# Patient Record
Sex: Female | Born: 1990 | Race: White | Hispanic: No | Marital: Married | Smoking: Current every day smoker
Health system: Southern US, Community
[De-identification: ages and names within clinical notes are randomized; demographics above are authoritative.]

## PROBLEM LIST (undated history)

## (undated) ENCOUNTER — Inpatient Hospital Stay (HOSPITAL_COMMUNITY): Payer: Self-pay

## (undated) DIAGNOSIS — J45909 Unspecified asthma, uncomplicated: Secondary | ICD-10-CM

## (undated) DIAGNOSIS — F319 Bipolar disorder, unspecified: Secondary | ICD-10-CM

## (undated) DIAGNOSIS — F4310 Post-traumatic stress disorder, unspecified: Secondary | ICD-10-CM

## (undated) DIAGNOSIS — Z62819 Personal history of unspecified abuse in childhood: Secondary | ICD-10-CM

## (undated) DIAGNOSIS — F99 Mental disorder, not otherwise specified: Secondary | ICD-10-CM

## (undated) HISTORY — DX: Post-traumatic stress disorder, unspecified: F43.10

## (undated) HISTORY — DX: Mental disorder, not otherwise specified: F99

## (undated) HISTORY — DX: Bipolar disorder, unspecified: F31.9

## (undated) HISTORY — DX: Unspecified asthma, uncomplicated: J45.909

## (undated) HISTORY — PX: NO PAST SURGERIES: SHX2092

## (undated) HISTORY — DX: Personal history of unspecified abuse in childhood: Z62.819

---

## 2013-03-01 ENCOUNTER — Encounter: Payer: Self-pay | Admitting: Obstetrics & Gynecology

## 2013-03-01 ENCOUNTER — Other Ambulatory Visit (HOSPITAL_COMMUNITY)
Admission: RE | Admit: 2013-03-01 | Discharge: 2013-03-01 | Disposition: A | Discharge status: Home / Self Care | Payer: Medicaid Other | Source: Ambulatory Visit | Attending: Obstetrics & Gynecology | Admitting: Obstetrics & Gynecology

## 2013-03-01 ENCOUNTER — Ambulatory Visit (INDEPENDENT_AMBULATORY_CARE_PROVIDER_SITE_OTHER): Payer: Medicaid Other | Admitting: Obstetrics & Gynecology

## 2013-03-01 VITALS — BP 118/73 | Temp 97.8°F | Ht 63.0 in | Wt 138.8 lb

## 2013-03-01 DIAGNOSIS — Z113 Encounter for screening for infections with a predominantly sexual mode of transmission: Secondary | ICD-10-CM | POA: Insufficient documentation

## 2013-03-01 DIAGNOSIS — O99330 Smoking (tobacco) complicating pregnancy, unspecified trimester: Secondary | ICD-10-CM

## 2013-03-01 DIAGNOSIS — Z3490 Encounter for supervision of normal pregnancy, unspecified, unspecified trimester: Secondary | ICD-10-CM | POA: Insufficient documentation

## 2013-03-01 DIAGNOSIS — Z01419 Encounter for gynecological examination (general) (routine) without abnormal findings: Secondary | ICD-10-CM | POA: Insufficient documentation

## 2013-03-01 DIAGNOSIS — O0932 Supervision of pregnancy with insufficient antenatal care, second trimester: Secondary | ICD-10-CM

## 2013-03-01 DIAGNOSIS — O3680X0 Pregnancy with inconclusive fetal viability, not applicable or unspecified: Secondary | ICD-10-CM

## 2013-03-01 DIAGNOSIS — O99332 Smoking (tobacco) complicating pregnancy, second trimester: Secondary | ICD-10-CM

## 2013-03-01 LAB — POCT URINALYSIS DIP (DEVICE)
Glucose, UA: NEGATIVE mg/dL
Leukocytes, UA: NEGATIVE
Nitrite: NEGATIVE
Protein, ur: 30 mg/dL — AB
Urobilinogen, UA: 1 mg/dL (ref 0.0–1.0)
pH: 7.5 (ref 5.0–8.0)

## 2013-03-01 NOTE — Progress Notes (Signed)
Pulse- 88 Weight gain 25-35lb New ob packet given Flu vaccine declined

## 2013-03-01 NOTE — Progress Notes (Signed)
Subjective:    [name] is being seen today for her first obstetrical visit.  This is not a planned pregnancy. She is at Unknown gestation. Her obstetrical history is significant for smoker. Relationship with FOB: significant other. Patient does intend to breast feed. Pregnancy history fully reviewed.  Menstrual History: OB History   Grav Para Term Preterm Abortions TAB SAB Ect Mult Living   1                No LMP recorded. Patient is pregnant. LMP unknown    The following portions of the patient's history were reviewed and updated as appropriate: allergies, current medications, past family history, past medical history, past social history, past surgical history and problem list.  Review of Systems Pertinent items are noted in HPI.    Objective:    BP 118/73  Temp(Src) 97.8 F (36.6 C)  Ht 5\' 3"  (1.6 m)  Wt 138 lb 12.8 oz (62.959 kg)  BMI 24.59 kg/m2  General Appearance:    Alert, cooperative, no distress, appears stated age              Throat:   Lips, mucosa, and tongue normal; teeth and gums normal  Neck:   Supple, symmetrical, trachea midline, no adenopathy;    thyroid:  no enlargement/tenderness/nodules; no carotid   bruit or JVD  Back:     Symmetric, no curvature, ROM normal, no CVA tenderness  Lungs:     Clear to auscultation bilaterally, respirations unlabored  Chest Wall:    No tenderness or deformity   Heart:    Regular rate and rhythm, S1 and S2 normal, no murmur, rub   or gallop  Breast Exam:    No tenderness, masses, or nipple abnormality  Abdomen:     Soft, non-tender, bowel sounds active all four quadrants,    no masses, no organomegaly; gravid ~20 weeks.  FH @umb   Genitalia:    Normal female without lesion, discharge or tenderness  Rectal:    Normal tone, normal prostate, no masses or tenderness;   guaiac negative stool  Extremities:   Extremities normal, atraumatic, no cyanosis or edema  Pulses:   2+ and symmetric all extremities  Skin:    Skin color, texture, turgor normal, no rashes or lesions; mulptiple tatoos  Lymph nodes:   Cervical, supraclavicular, and axillary nodes normal         Assessment:    Pregnancy at 20 and 0/7 weeks by exam Late PNC with unknown LMP   Plan:    Initial labs drawn. Prenatal vitamins. Problem list reviewed and updated. AFP3 discussed: requested. Role of ultrasound in pregnancy discussed; fetal survey: requested. Amniocentesis discussed: not indicated. Follow up in 4 weeks. 50% of 25 min visit spent on counseling and coordination of care.  Reviewed tob cessation

## 2013-03-01 NOTE — Patient Instructions (Signed)
Pregnancy and Smoking Smoking during pregnancy is very unhealthy for the mother and the developing fetus. The addictive drug in cigarettes (nicotine), carbon monoxide, and many other poisons are inhaled from a cigarette and are carried through your bloodstream to your fetus. Cigarette smoke contains more than 2,500 chemicals. It is not known which of these chemicals are harmful to the developing fetus. However, both nicotine and carbon monoxide play a role in causing health problems in pregnancy. Effects on the fetus of smoking during pregnancy:  Decrease in blood flow and oxygen to the uterus, placenta, and your fetus.  Increased heart rate of the fetus.  Slowing of your fetus's growth in the uterus (intrauterine growth retardation).  Placental problems. Placenta may partially cover or completely cover the opening to the cervix (placenta previa), or the placenta may partially or completely separate from the uterus (placental abruption).  Increase risk of pregnancy outside of the uterus (tubal pregnancy).  Premature rupture membranes, causing the sac that holds the fetus to break too early, resulting in premature birth and increased health risks to the newborn.  Increased risk of birth defects, including heart defects.  Increased risk of miscarriage. Newborns born to women who smoke during pregnancy:  Are more likely to be born too early (prematurely).  Are more likely to be at a low birth weight.  Are at risk for serious health problems, chronic or lifelong disabilities (cerebral palsy, mental retardation, learning problems), and possibly even death  Are at risk of Sudden Infant Death Syndrome (SIDS).  Have higher rates of miscarriage and stillbirth.  Have more lung and breathing (respiratory) problems. Long-term effects on a child's behavior: Some of the following trends are seen with children of smoking mothers:  Increased risk for drug abuse, behavior, and conduct  disorders.  Increased risk for smoking in adolescent girls.  Increased risk for negative behavior in 2-year-olds.  Increase risk for asthma, colic, and childhood obesity, which can lead to diabetes.  Increased risk for finger and toe disorders. Resources to stop smoking during pregnancy:  Counseling.  Psychological treatment.  Acupuncture.  Family intervention.  Hypnosis.  Medicines that are safe to take during pregnancy. Nicotine supplements have not been studied enough. They should only be considered when all other methods fail.  Telephone QUIT lines. Smoking and Breastfeeding:  Nicotine gets passed to the infant through a mother's breastmilk. This can cause nausea, colic, cramping, and diarrhea in the infant.  Smoking may reduce milk supply and interfere with the let-down response.  Even formula-fed infants of mothers who smoke have nicotine and cotinine (nicotine by-product) in their urine. Other resources to help stop smoking:  American Cancer Society: www.cancer.org  American Heart Association: www.americanheart.org  National Cancer Institute: www.cancer.gov  Smoke Free Families: www.smokefreefamilies.[address] [place] Line): 3140842239 START Document Released: 08/05/2004 Document Revised: 06/16/2011 Document Reviewed: 01/03/2009 [facility] Patient Information 2014 [place], [place]. Breastfeeding Deciding to breastfeed is one of the best choices you can make for you and your baby. A change in hormones during pregnancy causes your breast tissue to grow and increases the number and size of your milk ducts. These hormones also allow proteins, sugars, and fats from your blood supply to make breast milk in your milk-producing glands. Hormones prevent breast milk from being released before your baby is born as well as prompt milk flow after birth. Once breastfeeding has begun, thoughts of your baby, as well as his or her sucking or crying, can stimulate the release of  milk from your  milk-producing glands.  BENEFITS OF BREASTFEEDING For Your Baby  Your first milk (colostrum) helps your baby's digestive system function better.   There are antibodies in your milk that help your baby fight off infections.   Your baby has a lower incidence of asthma, allergies, and sudden infant death syndrome.   The nutrients in breast milk are better for your baby than infant formulas and are designed uniquely for your baby's needs.   Breast milk improves your baby's brain development.   Your baby is less likely to develop other conditions, such as childhood obesity, asthma, or type 2 diabetes mellitus.  For You   Breastfeeding helps to create a very special bond between you and your baby.   Breastfeeding is convenient. Breast milk is always available at the correct temperature and costs nothing.   Breastfeeding helps to burn calories and helps you lose the weight gained during pregnancy.   Breastfeeding makes your uterus contract to its prepregnancy size faster and slows bleeding (lochia) after you give birth.   Breastfeeding helps to lower your risk of developing type 2 diabetes mellitus, osteoporosis, and breast or ovarian cancer later in life. SIGNS THAT YOUR BABY IS HUNGRY Early Signs of Hunger  Increased alertness or activity.  Stretching.  Movement of the head from side to side.  Movement of the head and opening of the mouth when the corner of the mouth or cheek is stroked (rooting).  Increased sucking sounds, smacking lips, cooing, sighing, or squeaking.  Hand-to-mouth movements.  Increased sucking of fingers or hands. Late Signs of Hunger  Fussing.  Intermittent crying. Extreme Signs of Hunger Signs of extreme hunger will require calming and consoling before your baby will be able to breastfeed successfully. Do not wait for the following signs of extreme hunger to occur before you initiate breastfeeding:   Restlessness.  A  loud, strong cry.   Screaming. BREASTFEEDING BASICS Breastfeeding Initiation  Find a comfortable place to sit or lie down, with your neck and back well supported.  Place a pillow or rolled up blanket under your baby to bring him or her to the level of your breast (if you are seated). Nursing pillows are specially designed to help support your arms and your baby while you breastfeed.  Make sure that your baby's abdomen is facing your abdomen.   Gently massage your breast. With your fingertips, massage from your chest wall toward your nipple in a circular motion. This encourages milk flow. You may need to continue this action during the feeding if your milk flows slowly.  Support your breast with 4 fingers underneath and your thumb above your nipple. Make sure your fingers are well away from your nipple and your baby's mouth.   Stroke your baby's lips gently with your finger or nipple.   When your baby's mouth is open wide enough, quickly bring your baby to your breast, placing your entire nipple and as much of the colored area around your nipple (areola) as possible into your baby's mouth.   More areola should be visible above your baby's upper lip than below the lower lip.   Your baby's tongue should be between his or her lower gum and your breast.   Ensure that your baby's mouth is correctly positioned around your nipple (latched). Your baby's lips should create a seal on your breast and be turned out (everted).  It is common for your baby to suck about 2 3 minutes in order to start the flow of breast  milk. Latching Teaching your baby how to latch on to your breast properly is very important. An improper latch can cause nipple pain and decreased milk supply for you and poor weight gain in your baby. Also, if your baby is not latched onto your nipple properly, he or she may swallow some air during feeding. This can make your baby fussy. Burping your baby when you switch breasts  during the feeding can help to get rid of the air. However, teaching your baby to latch on properly is still the best way to prevent fussiness from swallowing air while breastfeeding. Signs that your baby has successfully latched on to your nipple:    Silent tugging or silent sucking, without causing you pain.   Swallowing heard between every 3 4 sucks.    Muscle movement above and in front of his or her ears while sucking.  Signs that your baby has not successfully latched on to nipple:   Sucking sounds or smacking sounds from your baby while breastfeeding.  Nipple pain. If you think your baby has not latched on correctly, slip your finger into the corner of your baby's mouth to break the suction and place it between your baby's gums. Attempt breastfeeding initiation again. Signs of Successful Breastfeeding Signs from your baby:   A gradual decrease in the number of sucks or complete cessation of sucking.   Falling asleep.   Relaxation of his or her body.   Retention of a small amount of milk in his or her mouth.   Letting go of your breast by himself or herself. Signs from you:  Breasts that have increased in firmness, weight, and size 1 3 hours after feeding.   Breasts that are softer immediately after breastfeeding.  Increased milk volume, as well as a change in milk consistency and color by the 5th day of breastfeeding.   Nipples that are not sore, cracked, or bleeding. Signs That Your Pecola Leisure is Getting Enough Milk  Wetting at least 3 diapers in a 24-hour period. The urine should be clear and pale yellow by age 29 days.  At least 3 stools in a 24-hour period by age 29 days. The stool should be soft and yellow.  At least 3 stools in a 24-hour period by age 74 days. The stool should be seedy and yellow.  No loss of weight greater than 10% of birth weight during the first 1 days of age.  Average weight gain of 4 7 ounces (120 210 mL) per week after age 6  days.  Consistent daily weight gain by age 29 days, without weight loss after the age of 2 weeks. After a feeding, your baby may spit up a small amount. This is common. BREASTFEEDING FREQUENCY AND DURATION Frequent feeding will help you make more milk and can prevent sore nipples and breast engorgement. Breastfeed when you feel the need to reduce the fullness of your breasts or when your baby shows signs of hunger. This is called "breastfeeding on demand." Avoid introducing a pacifier to your baby while you are working to establish breastfeeding (the first 4 6 weeks after your baby is born). After this time you may choose to use a pacifier. Research has shown that pacifier use during the first year of a baby's life decreases the risk of sudden infant death syndrome (SIDS). Allow your baby to feed on each breast as long as he or she wants. Breastfeed until your baby is finished feeding. When your baby unlatches or falls asleep  while feeding from the first breast, offer the second breast. Because newborns are often sleepy in the first few weeks of life, you may need to awaken your baby to get him or her to feed. Breastfeeding times will vary from baby to baby. However, the following rules can serve as a guide to help you ensure that your baby is properly fed:  Newborns (babies 73 weeks of age or younger) may breastfeed every 1 3 hours.  Newborns should not go longer than 3 hours during the day or 5 hours during the night without breastfeeding.  You should breastfeed your baby a minimum of 8 times in a 24-hour period until you begin to introduce solid foods to your baby at around 74 months of age. BREAST MILK PUMPING Pumping and storing breast milk allows you to ensure that your baby is exclusively fed your breast milk, even at times when you are unable to breastfeed. This is especially important if you are going back to work while you are still breastfeeding or when you are not able to be present during  feedings. Your lactation consultant can give you guidelines on how long it is safe to store breast milk.  A breast pump is a machine that allows you to pump milk from your breast into a sterile bottle. The pumped breast milk can then be stored in a refrigerator or freezer. Some breast pumps are operated by hand, while others use electricity. Ask your lactation consultant which type will work best for you. Breast pumps can be purchased, but some hospitals and breastfeeding support groups lease breast pumps on a monthly basis. A lactation consultant can teach you how to hand express breast milk, if you prefer not to use a pump.  CARING FOR YOUR BREASTS WHILE YOU BREASTFEED Nipples can become dry, cracked, and sore while breastfeeding. The following recommendations can help keep your breasts moisturized and healthy:  Avoid using soap on your nipples.   Wear a supportive bra. Although not required, special nursing bras and tank tops are designed to allow access to your breasts for breastfeeding without taking off your entire bra or top. Avoid wearing underwire style bras or extremely tight bras.  Air dry your nipples for 3 after each feeding.   Use only cotton bra pads to absorb leaked breast milk. Leaking of breast milk between feedings is normal.   Use lanolin on your nipples after breastfeeding. Lanolin helps to maintain your skin's normal moisture barrier. If you use pure lanolin you do not need to wash it off before feeding your baby again. Pure lanolin is not toxic to your baby. You may also hand express a few drops of breast milk and gently massage that milk into your nipples and allow the milk to air dry. In the first few weeks after giving birth, some women experience extremely full breasts (engorgement). Engorgement can make your breasts feel heavy, warm, and tender to the touch. Engorgement peaks within 3 5 days after you give birth. The following recommendations can help ease  engorgement:  Completely empty your breasts while breastfeeding or pumping. You may want to start by applying warm, moist heat (in the shower or with warm water-soaked hand towels) just before feeding or pumping. This increases circulation and helps the milk flow. If your baby does not completely empty your breasts while breastfeeding, pump any extra milk after he or she is finished.  Wear a snug bra (nursing or regular) or tank top for 1 2 days to  signal your body to slightly decrease milk production.  Apply ice packs to your breasts, unless this is too uncomfortable for you.  Make sure that your baby is latched on and positioned properly while breastfeeding. If engorgement persists after 48 hours of following these recommendations, contact your health care provider or a Advertising copywriter. OVERALL HEALTH CARE RECOMMENDATIONS WHILE BREASTFEEDING  Eat healthy foods. Alternate between meals and snacks, eating 3 of each per day. Because what you eat affects your breast milk, some of the foods may make your baby more irritable than usual. Avoid eating these foods if you are sure that they are negatively affecting your baby.  Drink milk, fruit juice, and water to satisfy your thirst (about 10 glasses a day).   Rest often, relax, and continue to take your prenatal vitamins to prevent fatigue, stress, and anemia.  Continue breast self-awareness checks.  Avoid chewing and smoking tobacco.  Avoid alcohol and drug use. Some medicines that may be harmful to your baby can pass through breast milk. It is important to ask your health care provider before taking any medicine, including all over-the-counter and prescription medicine as well as vitamin and herbal supplements. It is possible to become pregnant while breastfeeding. If birth control is desired, ask your health care provider about options that will be safe for your baby. SEEK MEDICAL CARE IF:   You feel like you want to stop breastfeeding  or have become frustrated with breastfeeding.  You have painful breasts or nipples.  Your nipples are cracked or bleeding.  Your breasts are red, tender, or warm.  You have a swollen area on either breast.  You have a fever or chills.  You have nausea or vomiting.  You have drainage other than breast milk from your nipples.  Your breasts do not become full before feedings by the 5th day after you give birth.  You feel sad and depressed.  Your baby is too sleepy to eat well.  Your baby is having trouble sleeping.   Your baby is wetting less than 3 diapers in a 24-hour period.  Your baby has less than 3 stools in a 24-hour period.  Your baby's skin or the white part of his or her eyes becomes yellow.   Your baby is not gaining weight by 71 days of age. SEEK IMMEDIATE MEDICAL CARE IF:   Your baby is overly tired (lethargic) and does not want to wake up and feed.  Your baby develops an unexplained fever. Document Released: 03/24/2005 Document Revised: 11/24/2012 Document Reviewed: 09/15/2012 [facility] Patient Information 2014 [place], [place].

## 2013-03-02 LAB — OBSTETRIC PANEL
Antibody Screen: NEGATIVE
Basophils Absolute: 0 10*3/uL (ref 0.0–0.1)
Basophils Relative: 0 % (ref 0–1)
Eosinophils Relative: 1 % (ref 0–5)
Hepatitis B Surface Ag: NEGATIVE
Lymphs Abs: 2 10*3/uL (ref 0.7–4.0)
MCHC: 34.6 g/dL (ref 30.0–36.0)
MCV: 87 fL (ref 78.0–100.0)
Monocytes Absolute: 0.7 10*3/uL (ref 0.1–1.0)
Neutro Abs: 6.6 10*3/uL (ref 1.7–7.7)
Neutrophils Relative %: 69 % (ref 43–77)
Platelets: 194 10*3/uL (ref 150–400)
RBC: 3.69 MIL/uL — ABNORMAL LOW (ref 3.87–5.11)
RDW: 13.4 % (ref 11.5–15.5)
Rubella: 3.73 Index — ABNORMAL HIGH (ref <0.90)
WBC: 9.5 10*3/uL (ref 4.0–10.5)

## 2013-03-02 LAB — PRESCRIPTION MONITORING PROFILE (19 PANEL)
Amphetamine/Meth: NEGATIVE ng/mL
Barbiturate Screen, Urine: NEGATIVE ng/mL
Cannabinoid Scrn, Ur: NEGATIVE ng/mL
Carisoprodol, Urine: NEGATIVE ng/mL
Cocaine Metabolites: NEGATIVE ng/mL
Creatinine, Urine: 195.23 mg/dL (ref >20.0)
Fentanyl, Ur: NEGATIVE ng/mL
Meperidine, Ur: NEGATIVE ng/mL
Methadone Screen, Urine: NEGATIVE ng/mL
Methaqualone: NEGATIVE ng/mL
Oxycodone Screen, Ur: NEGATIVE ng/mL
Phencyclidine, Ur: NEGATIVE ng/mL
Tapentadol, urine: NEGATIVE ng/mL
pH, Initial: 7.5 pH (ref 4.5–8.9)

## 2013-03-02 LAB — CULTURE, OB URINE: Organism ID, Bacteria: NO GROWTH

## 2013-03-09 ENCOUNTER — Ambulatory Visit (HOSPITAL_COMMUNITY)
Admission: RE | Admit: 2013-03-09 | Discharge: 2013-03-09 | Disposition: A | Discharge status: Home / Self Care | Payer: Medicaid Other | Source: Ambulatory Visit | Attending: Obstetrics & Gynecology | Admitting: Obstetrics & Gynecology

## 2013-03-09 ENCOUNTER — Other Ambulatory Visit: Payer: Self-pay | Admitting: Obstetrics & Gynecology

## 2013-03-09 DIAGNOSIS — O0932 Supervision of pregnancy with insufficient antenatal care, second trimester: Secondary | ICD-10-CM

## 2013-03-09 DIAGNOSIS — Z3689 Encounter for other specified antenatal screening: Secondary | ICD-10-CM | POA: Insufficient documentation

## 2013-03-09 DIAGNOSIS — O99332 Smoking (tobacco) complicating pregnancy, second trimester: Secondary | ICD-10-CM

## 2013-03-09 DIAGNOSIS — O3680X0 Pregnancy with inconclusive fetal viability, not applicable or unspecified: Secondary | ICD-10-CM

## 2013-03-09 DIAGNOSIS — O0930 Supervision of pregnancy with insufficient antenatal care, unspecified trimester: Secondary | ICD-10-CM | POA: Insufficient documentation

## 2013-03-12 ENCOUNTER — Encounter: Payer: Self-pay | Admitting: Obstetrics & Gynecology

## 2013-03-14 ENCOUNTER — Telehealth: Payer: Self-pay | Admitting: *Deleted

## 2013-03-14 DIAGNOSIS — Z3400 Encounter for supervision of normal first pregnancy, unspecified trimester: Secondary | ICD-10-CM

## 2013-03-14 NOTE — Telephone Encounter (Signed)
Message copied by Gerome Apley on Mon Mar 14, 2013  4:08 PM ------      Message from: Willodean Rosenthal      Created: Sun Mar 13, 2013  3:33 PM       Please schedule f/u OB sono in 4-6 weeks to complete anatomy.            Thx,      clh-S ------

## 2013-03-14 NOTE — Telephone Encounter (Signed)
Ordered and scheduled ultrasound for 04/06/13. Called patient, unable to leave message as I heard message subscriber is not taking incoming calls.  Left note on next ob appt  to tell pt of [place] appt.

## 2013-03-15 NOTE — Telephone Encounter (Signed)
Called patient and message stated this person is not accepting incoming calls. Will send patient mychart message, since she is active and prefers that form of communication

## 2013-03-29 ENCOUNTER — Encounter: Payer: Self-pay | Admitting: Obstetrics & Gynecology

## 2013-04-06 ENCOUNTER — Ambulatory Visit (HOSPITAL_COMMUNITY)
Admission: RE | Admit: 2013-04-06 | Discharge: 2013-04-06 | Disposition: A | Discharge status: Home / Self Care | Payer: Medicaid Other | Source: Ambulatory Visit | Attending: Obstetrics & Gynecology | Admitting: Obstetrics & Gynecology

## 2013-04-06 DIAGNOSIS — O0930 Supervision of pregnancy with insufficient antenatal care, unspecified trimester: Secondary | ICD-10-CM | POA: Insufficient documentation

## 2013-04-06 DIAGNOSIS — Z3400 Encounter for supervision of normal first pregnancy, unspecified trimester: Secondary | ICD-10-CM

## 2013-04-06 DIAGNOSIS — Z3689 Encounter for other specified antenatal screening: Secondary | ICD-10-CM | POA: Insufficient documentation

## 2013-04-06 DIAGNOSIS — O0932 Supervision of pregnancy with insufficient antenatal care, second trimester: Secondary | ICD-10-CM

## 2013-04-07 NOTE — L&D Delivery Note (Signed)
Delivery by Dr. Adriana Simasook under direct supervision by me.  Cheral MarkerKimberly R. Hykeem Ojeda, CNM, [facility] CenterWHNP-BC 07/10/2013 7:05 AM

## 2013-04-07 NOTE — L&D Delivery Note (Signed)
Delivery Note At 6:25 AM a viable female was delivered via Vaginal, Spontaneous Delivery (Presentation: Left Occiput Anterior).  APGAR: 9, 9; weight - pending. Placenta status: Intact, Spontaneous.  Cord: 3 vessels with the following complications: None.  Cord pH: N/A.  Anesthesia: Epidural  Episiotomy: None Lacerations: Labial wall tear x 2 and 1st degree perineal  Suture Repair: 3.0 vicryl and 4.0 vicryl. Est. Blood Loss (mL): 250  Mom to postpartum.  Baby to Couplet care / Skin to Skin.  [name], Logen Heintzelman 07/10/2013, 6:52 AM

## 2013-04-12 ENCOUNTER — Ambulatory Visit (INDEPENDENT_AMBULATORY_CARE_PROVIDER_SITE_OTHER): Payer: Medicaid Other | Admitting: Advanced Practice Midwife

## 2013-04-12 ENCOUNTER — Encounter: Payer: Self-pay | Admitting: Advanced Practice Midwife

## 2013-04-12 VITALS — BP 111/67 | Temp 96.6°F | Wt 144.2 lb

## 2013-04-12 DIAGNOSIS — O99330 Smoking (tobacco) complicating pregnancy, unspecified trimester: Secondary | ICD-10-CM

## 2013-04-12 DIAGNOSIS — O99332 Smoking (tobacco) complicating pregnancy, second trimester: Secondary | ICD-10-CM

## 2013-04-12 LAB — POCT URINALYSIS DIP (DEVICE)
BILIRUBIN URINE: NEGATIVE
Glucose, UA: NEGATIVE mg/dL
Hgb urine dipstick: NEGATIVE
Ketones, ur: NEGATIVE mg/dL
LEUKOCYTES UA: NEGATIVE
NITRITE: NEGATIVE
PH: 7 (ref 5.0–8.0)
PROTEIN: NEGATIVE mg/dL
Specific Gravity, Urine: 1.01 (ref 1.005–1.030)
Urobilinogen, UA: 0.2 mg/dL (ref 0.0–1.0)

## 2013-04-12 NOTE — Progress Notes (Signed)
Pulse- 87 

## 2013-04-12 NOTE — Progress Notes (Signed)
Doing well.  Good fetal movement, denies vaginal bleeding, LOF, regular contractions.  Discussed risks of smoking r/t low birth weight and SIDS.  Pt states understanding.

## 2013-04-13 LAB — CBC
HCT: 31 % — ABNORMAL LOW (ref 36.0–46.0)
Hemoglobin: 11 g/dL — ABNORMAL LOW (ref 12.0–15.0)
MCH: 30.4 pg (ref 26.0–34.0)
MCHC: 35.5 g/dL (ref 30.0–36.0)
MCV: 85.6 fL (ref 78.0–100.0)
Platelets: 189 10*3/uL (ref 150–400)
RBC: 3.62 MIL/uL — AB (ref 3.87–5.11)
RDW: 13.2 % (ref 11.5–15.5)
WBC: 7.3 10*3/uL (ref 4.0–10.5)

## 2013-04-13 LAB — RPR

## 2013-04-13 LAB — HIV ANTIBODY (ROUTINE TESTING W REFLEX): HIV: NONREACTIVE

## 2013-04-13 LAB — GLUCOSE TOLERANCE, 1 HOUR (50G) W/O FASTING: Glucose, 1 Hour GTT: 72 mg/dL (ref 70–140)

## 2013-04-26 ENCOUNTER — Ambulatory Visit (INDEPENDENT_AMBULATORY_CARE_PROVIDER_SITE_OTHER): Payer: Medicaid Other | Admitting: Obstetrics and Gynecology

## 2013-04-26 VITALS — BP 109/71 | Temp 97.3°F | Wt 145.9 lb

## 2013-04-26 DIAGNOSIS — Z3480 Encounter for supervision of other normal pregnancy, unspecified trimester: Secondary | ICD-10-CM

## 2013-04-26 DIAGNOSIS — O99332 Smoking (tobacco) complicating pregnancy, second trimester: Secondary | ICD-10-CM

## 2013-04-26 DIAGNOSIS — O99330 Smoking (tobacco) complicating pregnancy, unspecified trimester: Secondary | ICD-10-CM

## 2013-04-26 LAB — POCT URINALYSIS DIP (DEVICE)
BILIRUBIN URINE: NEGATIVE
Glucose, UA: NEGATIVE mg/dL
Hgb urine dipstick: NEGATIVE
KETONES UR: NEGATIVE mg/dL
Leukocytes, UA: NEGATIVE
Nitrite: NEGATIVE
PH: 6 (ref 5.0–8.0)
Protein, ur: NEGATIVE mg/dL
Specific Gravity, Urine: <=1.005 (ref 1.005–1.030)
Urobilinogen, UA: 0.2 mg/dL (ref 0.0–1.0)

## 2013-04-26 NOTE — Progress Notes (Signed)
Evaluation and management procedures were performed by SNM under my supervision/collaboration. Chart reviewed, patient examined by me and I agree with management and plan. FH checked.

## 2013-04-26 NOTE — Patient Instructions (Signed)
Second Trimester of Pregnancy The second trimester is from week 13 through week 28, months 4 through 6. The second trimester is often a time when you feel your best. Your body has also adjusted to being pregnant, and you begin to feel better physically. Usually, morning sickness has lessened or quit completely, you may have more energy, and you may have an increase in appetite. The second trimester is also a time when the fetus is growing rapidly. At the end of the sixth month, the fetus is about 9 inches long and weighs about 1 pounds. You will likely begin to feel the baby move (quickening) between 18 and 20 weeks of the pregnancy. BODY CHANGES Your body goes through many changes during pregnancy. The changes vary from woman to woman.   Your weight will continue to increase. You will notice your lower abdomen bulging out.  You may begin to get stretch marks on your hips, abdomen, and breasts.  You may develop headaches that can be relieved by medicines approved by your caregiver.  You may urinate more often because the fetus is pressing on your bladder.  You may develop or continue to have heartburn as a result of your pregnancy.  You may develop constipation because certain hormones are causing the muscles that push waste through your intestines to slow down.  You may develop hemorrhoids or swollen, bulging veins (varicose veins).  You may have back pain because of the weight gain and pregnancy hormones relaxing your joints between the bones in your pelvis and as a result of a shift in weight and the muscles that support your balance.  Your breasts will continue to grow and be tender.  Your gums may bleed and may be sensitive to brushing and flossing.  Dark spots or blotches (chloasma, mask of pregnancy) may develop on your face. This will likely fade after the baby is born.  A dark line from your belly button to the pubic area (linea nigra) may appear. This will likely fade after the  baby is born. WHAT TO EXPECT AT YOUR PRENATAL VISITS During a routine prenatal visit:  You will be weighed to make sure you and the fetus are growing normally.  Your blood pressure will be taken.  Your abdomen will be measured to track your baby's growth.  The fetal heartbeat will be listened to.  Any test results from the previous visit will be discussed. Your caregiver may ask you:  How you are feeling.  If you are feeling the baby move.  If you have had any abnormal symptoms, such as leaking fluid, bleeding, severe headaches, or abdominal cramping.  If you have any questions. Other tests that may be performed during your second trimester include:  Blood tests that check for:  Low iron levels (anemia).  Gestational diabetes (between 24 and 28 weeks).  Rh antibodies.  Urine tests to check for infections, diabetes, or protein in the urine.  An ultrasound to confirm the proper growth and development of the baby.  An amniocentesis to check for possible genetic problems.  Fetal screens for spina bifida and Down syndrome. HOME CARE INSTRUCTIONS   Avoid all smoking, herbs, alcohol, and unprescribed drugs. These chemicals affect the formation and growth of the baby.  Follow your caregiver's instructions regarding medicine use. There are medicines that are either safe or unsafe to take during pregnancy.  Exercise only as directed by your caregiver. Experiencing uterine cramps is a good sign to stop exercising.  Continue to eat regular,   healthy meals.  Wear a good support bra for breast tenderness.  Do not use hot tubs, steam rooms, or saunas.  Wear your seat belt at all times when driving.  Avoid raw meat, uncooked cheese, cat litter boxes, and soil used by cats. These carry germs that can cause birth defects in the baby.  Take your prenatal vitamins.  Try taking a stool softener (if your caregiver approves) if you develop constipation. Eat more high-fiber foods,  such as fresh vegetables or fruit and whole grains. Drink plenty of fluids to keep your urine clear or pale yellow.  Take warm sitz baths to soothe any pain or discomfort caused by hemorrhoids. Use hemorrhoid cream if your caregiver approves.  If you develop varicose veins, wear support hose. Elevate your feet for 15 minutes, 3 4 times a day. Limit salt in your diet.  Avoid heavy lifting, wear low heel shoes, and practice good posture.  Rest with your legs elevated if you have leg cramps or low back pain.  Visit your dentist if you have not gone yet during your pregnancy. Use a soft toothbrush to brush your teeth and be gentle when you floss.  A sexual relationship may be continued unless your caregiver directs you otherwise.  Continue to go to all your prenatal visits as directed by your caregiver. SEEK MEDICAL CARE IF:   You have dizziness.  You have mild pelvic cramps, pelvic pressure, or nagging pain in the abdominal area.  You have persistent nausea, vomiting, or diarrhea.  You have a bad smelling vaginal discharge.  You have pain with urination. SEEK IMMEDIATE MEDICAL CARE IF:   You have a fever.  You are leaking fluid from your vagina.  You have spotting or bleeding from your vagina.  You have severe abdominal cramping or pain.  You have rapid weight gain or loss.  You have shortness of breath with chest pain.  You notice sudden or extreme swelling of your face, hands, ankles, feet, or legs.  You have not felt your baby move in over an hour.  You have severe headaches that do not go away with medicine.  You have vision changes. Document Released: 03/18/2001 Document Revised: 11/24/2012 Document Reviewed: 05/25/2012 Exit[employer] Patient Information 2014 [employer].  

## 2013-04-26 NOTE — Progress Notes (Signed)
P= 79 Requests PNV to be prescribed.

## 2013-04-26 NOTE — Progress Notes (Signed)
No pregnancy related complaints today.  Baby has been very active. Denies VB, LOF, contractions.

## 2013-05-02 ENCOUNTER — Inpatient Hospital Stay (HOSPITAL_COMMUNITY)
Admission: AD | Admit: 2013-05-02 | Discharge: 2013-05-02 | Disposition: A | Discharge status: Home / Self Care | Payer: Medicaid Other | Source: Ambulatory Visit | Attending: Obstetrics and Gynecology | Admitting: Obstetrics and Gynecology

## 2013-05-02 ENCOUNTER — Encounter (HOSPITAL_COMMUNITY): Payer: Self-pay | Admitting: *Deleted

## 2013-05-02 DIAGNOSIS — A499 Bacterial infection, unspecified: Secondary | ICD-10-CM | POA: Insufficient documentation

## 2013-05-02 DIAGNOSIS — O4700 False labor before 37 completed weeks of gestation, unspecified trimester: Secondary | ICD-10-CM | POA: Insufficient documentation

## 2013-05-02 DIAGNOSIS — O2390 Unspecified genitourinary tract infection in pregnancy, unspecified trimester: Secondary | ICD-10-CM | POA: Insufficient documentation

## 2013-05-02 DIAGNOSIS — O368130 Decreased fetal movements, third trimester, not applicable or unspecified: Secondary | ICD-10-CM

## 2013-05-02 DIAGNOSIS — R109 Unspecified abdominal pain: Secondary | ICD-10-CM | POA: Insufficient documentation

## 2013-05-02 DIAGNOSIS — N760 Acute vaginitis: Secondary | ICD-10-CM | POA: Insufficient documentation

## 2013-05-02 DIAGNOSIS — O368190 Decreased fetal movements, unspecified trimester, not applicable or unspecified: Secondary | ICD-10-CM | POA: Insufficient documentation

## 2013-05-02 DIAGNOSIS — O99332 Smoking (tobacco) complicating pregnancy, second trimester: Secondary | ICD-10-CM

## 2013-05-02 DIAGNOSIS — B9689 Other specified bacterial agents as the cause of diseases classified elsewhere: Secondary | ICD-10-CM | POA: Insufficient documentation

## 2013-05-02 DIAGNOSIS — Z3490 Encounter for supervision of normal pregnancy, unspecified, unspecified trimester: Secondary | ICD-10-CM

## 2013-05-02 DIAGNOSIS — O99330 Smoking (tobacco) complicating pregnancy, unspecified trimester: Secondary | ICD-10-CM | POA: Insufficient documentation

## 2013-05-02 LAB — URINALYSIS, ROUTINE W REFLEX MICROSCOPIC
BILIRUBIN URINE: NEGATIVE
Glucose, UA: NEGATIVE mg/dL
HGB URINE DIPSTICK: NEGATIVE
Ketones, ur: NEGATIVE mg/dL
Leukocytes, UA: NEGATIVE
Nitrite: NEGATIVE
PH: 6 (ref 5.0–8.0)
Protein, ur: NEGATIVE mg/dL
Urobilinogen, UA: 0.2 mg/dL (ref 0.0–1.0)

## 2013-05-02 LAB — WET PREP, GENITAL
TRICH WET PREP: NONE SEEN
WBC WET PREP: NONE SEEN
Yeast Wet Prep HPF POC: NONE SEEN

## 2013-05-02 MED ORDER — METRONIDAZOLE 500 MG PO TABS: 500.0000 mg | ORAL_TABLET | Freq: Two times a day (BID) | ORAL | Status: DC
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
## 2013-05-02 NOTE — MAU Note (Signed)
Dr. Ike Benedom notified of pt.

## 2013-05-02 NOTE — MAU Note (Signed)
Dr. Ike Benedom at the bedside.  U/S performed.

## 2013-05-02 NOTE — Discharge Instructions (Signed)
Fetal Movement Counts Patient Name: __________________________________________________ Patient Due Date: ____________________ Performing a fetal movement count is highly recommended in high-risk pregnancies, but it is good for every pregnant woman to do. Your caregiver may ask you to start counting fetal movements at 28 weeks of the pregnancy. Fetal movements often increase:  After eating a full meal.  After physical activity.  After eating or drinking something sweet or cold.  At rest. Pay attention to when you feel the baby is most active. This will help you notice a pattern of your baby's sleep and wake cycles and what factors contribute to an increase in fetal movement. It is important to perform a fetal movement count at the same time each day when your baby is normally most active.  HOW TO COUNT FETAL MOVEMENTS 1. Find a quiet and comfortable area to sit or lie down on your left side. Lying on your left side provides the best blood and oxygen circulation to your baby. 2. Write down the day and time on a sheet of paper or in a journal. 3. Start counting kicks, flutters, swishes, rolls, or jabs in a 2 hour period. You should feel at least 10 movements within 2 hours. 4. If you do not feel 10 movements in 2 hours, wait 2 3 hours and count again. Look for a change in the pattern or not enough counts in 2 hours. SEEK MEDICAL CARE IF:  You feel less than 10 counts in 2 hours, tried twice.  There is no movement in over an hour.  The pattern is changing or taking longer each day to reach 10 counts in 2 hours.  You feel the baby is not moving as he or she usually does. Date: ____________ Movements: ____________ Start time: ____________ Finish time: ____________  Date: ____________ Movements: ____________ Start time: ____________ Finish time: ____________ Date: ____________ Movements: ____________ Start time: ____________ Finish time: ____________ Date: ____________ Movements: ____________  Start time: ____________ Finish time: ____________ Date: ____________ Movements: ____________ Start time: ____________ Finish time: ____________ Date: ____________ Movements: ____________ Start time: ____________ Finish time: ____________ Date: ____________ Movements: ____________ Start time: ____________ Finish time: ____________ Date: ____________ Movements: ____________ Start time: ____________ Finish time: ____________  Date: ____________ Movements: ____________ Start time: ____________ Finish time: ____________ Date: ____________ Movements: ____________ Start time: ____________ Finish time: ____________ Date: ____________ Movements: ____________ Start time: ____________ Finish time: ____________ Date: ____________ Movements: ____________ Start time: ____________ Finish time: ____________ Date: ____________ Movements: ____________ Start time: ____________ Finish time: ____________ Date: ____________ Movements: ____________ Start time: ____________ Finish time: ____________ Date: ____________ Movements: ____________ Start time: ____________ Finish time: ____________  Date: ____________ Movements: ____________ Start time: ____________ Finish time: ____________ Date: ____________ Movements: ____________ Start time: ____________ Finish time: ____________ Date: ____________ Movements: ____________ Start time: ____________ Finish time: ____________ Date: ____________ Movements: ____________ Start time: ____________ Finish time: ____________ Date: ____________ Movements: ____________ Start time: ____________ Finish time: ____________ Date: ____________ Movements: ____________ Start time: ____________ Finish time: ____________ Date: ____________ Movements: ____________ Start time: ____________ Finish time: ____________  Date: ____________ Movements: ____________ Start time: ____________ Finish time: ____________ Date: ____________ Movements: ____________ Start time: ____________ Finish time:  ____________ Date: ____________ Movements: ____________ Start time: ____________ Finish time: ____________ Date: ____________ Movements: ____________ Start time: ____________ Finish time: ____________ Date: ____________ Movements: ____________ Start time: ____________ Finish time: ____________ Date: ____________ Movements: ____________ Start time: ____________ Finish time: ____________ Date: ____________ Movements: ____________ Start time: ____________ Finish time: ____________  Date: ____________ Movements: ____________ Start time: ____________ Finish   time: ____________ Date: ____________ Movements: ____________ Start time: ____________ Finish time: ____________ Date: ____________ Movements: ____________ Start time: ____________ Finish time: ____________ Date: ____________ Movements: ____________ Start time: ____________ Finish time: ____________ Date: ____________ Movements: ____________ Start time: ____________ Finish time: ____________ Date: ____________ Movements: ____________ Start time: ____________ Finish time: ____________ Date: ____________ Movements: ____________ Start time: ____________ Finish time: ____________  Date: ____________ Movements: ____________ Start time: ____________ Finish time: ____________ Date: ____________ Movements: ____________ Start time: ____________ Finish time: ____________ Date: ____________ Movements: ____________ Start time: ____________ Finish time: ____________ Date: ____________ Movements: ____________ Start time: ____________ Finish time: ____________ Date: ____________ Movements: ____________ Start time: ____________ Finish time: ____________ Date: ____________ Movements: ____________ Start time: ____________ Finish time: ____________ Date: ____________ Movements: ____________ Start time: ____________ Finish time: ____________  Date: ____________ Movements: ____________ Start time: ____________ Finish time: ____________ Date: ____________ Movements:  ____________ Start time: ____________ Finish time: ____________ Date: ____________ Movements: ____________ Start time: ____________ Finish time: ____________ Date: ____________ Movements: ____________ Start time: ____________ Finish time: ____________ Date: ____________ Movements: ____________ Start time: ____________ Finish time: ____________ Date: ____________ Movements: ____________ Start time: ____________ Finish time: ____________ Date: ____________ Movements: ____________ Start time: ____________ Finish time: ____________  Date: ____________ Movements: ____________ Start time: ____________ Finish time: ____________ Date: ____________ Movements: ____________ Start time: ____________ Finish time: ____________ Date: ____________ Movements: ____________ Start time: ____________ Finish time: ____________ Date: ____________ Movements: ____________ Start time: ____________ Finish time: ____________ Date: ____________ Movements: ____________ Start time: ____________ Finish time: ____________ Date: ____________ Movements: ____________ Start time: ____________ Finish time: ____________ Document Released: 04/23/2006 Document Revised: 03/10/2012 Document Reviewed: 01/19/2012 [employer] Patient Information 2014 [employer].  

## 2013-05-02 NOTE — MAU Note (Signed)
Pt reports decreased fetal movement for the last 2 days, left lower abd pain

## 2013-05-04 ENCOUNTER — Encounter: Payer: Self-pay | Admitting: Obstetrics & Gynecology

## 2013-05-04 NOTE — MAU Provider Note (Signed)
Attestation of Attending Supervision of Advanced Practitioner: Evaluation and management procedures were performed by the PA/NP/CNM/OB Fellow under my supervision/collaboration. Chart reviewed and agree with management and plan.  Madi Bonfiglio V 05/04/2013 5:28 PM

## 2013-05-10 ENCOUNTER — Ambulatory Visit (INDEPENDENT_AMBULATORY_CARE_PROVIDER_SITE_OTHER): Payer: Medicaid Other | Admitting: Family Medicine

## 2013-05-10 VITALS — BP 120/73 | Temp 97.7°F | Wt 150.2 lb

## 2013-05-10 DIAGNOSIS — O99330 Smoking (tobacco) complicating pregnancy, unspecified trimester: Secondary | ICD-10-CM

## 2013-05-10 DIAGNOSIS — O0932 Supervision of pregnancy with insufficient antenatal care, second trimester: Secondary | ICD-10-CM

## 2013-05-10 DIAGNOSIS — Z3490 Encounter for supervision of normal pregnancy, unspecified, unspecified trimester: Secondary | ICD-10-CM

## 2013-05-10 DIAGNOSIS — O99332 Smoking (tobacco) complicating pregnancy, second trimester: Secondary | ICD-10-CM

## 2013-05-10 DIAGNOSIS — Z3400 Encounter for supervision of normal first pregnancy, unspecified trimester: Secondary | ICD-10-CM

## 2013-05-10 LAB — POCT URINALYSIS DIP (DEVICE)
Bilirubin Urine: NEGATIVE
GLUCOSE, UA: NEGATIVE mg/dL
Hgb urine dipstick: NEGATIVE
KETONES UR: 15 mg/dL — AB
LEUKOCYTES UA: NEGATIVE
NITRITE: NEGATIVE
Protein, ur: NEGATIVE mg/dL
Specific Gravity, Urine: 1.02 (ref 1.005–1.030)
Urobilinogen, UA: 0.2 mg/dL (ref 0.0–1.0)
pH: 7.5 (ref 5.0–8.0)

## 2013-05-10 MED ORDER — PRENATAL PLUS 27-1 MG PO TABS: 1.0000 | ORAL_TABLET | Freq: Every day | ORAL | Status: DC

## 2013-05-10 NOTE — Patient Instructions (Signed)
Third Trimester of Pregnancy  The third trimester is from week 29 through week 42, months 7 through 9. The third trimester is a time when the fetus is growing rapidly. At the end of the ninth month, the fetus is about 20 inches in length and weighs 6 10 pounds.   BODY CHANGES  Your body goes through many changes during pregnancy. The changes vary from woman to woman.    Your weight will continue to increase. You can expect to gain 25 35 pounds (11 16 kg) by the end of the pregnancy.   You may begin to get stretch marks on your hips, abdomen, and breasts.   You may urinate more often because the fetus is moving lower into your pelvis and pressing on your bladder.   You may develop or continue to have heartburn as a result of your pregnancy.   You may develop constipation because certain hormones are causing the muscles that push waste through your intestines to slow down.   You may develop hemorrhoids or swollen, bulging veins (varicose veins).   You may have pelvic pain because of the weight gain and pregnancy hormones relaxing your joints between the bones in your pelvis. Back aches may result from over exertion of the muscles supporting your posture.   Your breasts will continue to grow and be tender. A yellow discharge may leak from your breasts called colostrum.   Your belly button may stick out.   You may feel short of breath because of your expanding uterus.   You may notice the fetus "dropping," or moving lower in your abdomen.   You may have a bloody mucus discharge. This usually occurs a few days to a week before labor begins.   Your cervix becomes thin and soft (effaced) near your due date.  WHAT TO EXPECT AT YOUR PRENATAL EXAMS   You will have prenatal exams every 2 weeks until week 36. Then, you will have weekly prenatal exams. During a routine prenatal visit:   You will be weighed to make sure you and the fetus are growing normally.   Your blood pressure is taken.   Your abdomen will be  measured to track your baby's growth.   The fetal heartbeat will be listened to.   Any test results from the previous visit will be discussed.   You may have a cervical check near your due date to see if you have effaced.  At around 36 weeks, your caregiver will check your cervix. At the same time, your caregiver will also perform a test on the secretions of the vaginal tissue. This test is to determine if a type of bacteria, Group B streptococcus, is present. Your caregiver will explain this further.  Your caregiver may ask you:   What your birth plan is.   How you are feeling.   If you are feeling the baby move.   If you have had any abnormal symptoms, such as leaking fluid, bleeding, severe headaches, or abdominal cramping.   If you have any questions.  Other tests or screenings that may be performed during your third trimester include:   Blood tests that check for low iron levels (anemia).   Fetal testing to check the health, activity level, and growth of the fetus. Testing is done if you have certain medical conditions or if there are problems during the pregnancy.  FALSE LABOR  You may feel small, irregular contractions that eventually go away. These are called Braxton Hicks contractions, or   false labor. Contractions may last for hours, days, or even weeks before true labor sets in. If contractions come at regular intervals, intensify, or become painful, it is best to be seen by your caregiver.   SIGNS OF LABOR    Menstrual-like cramps.   Contractions that are 5 minutes apart or less.   Contractions that start on the top of the uterus and spread down to the lower abdomen and back.   A sense of increased pelvic pressure or back pain.   A watery or bloody mucus discharge that comes from the vagina.  If you have any of these signs before the 37th week of pregnancy, call your caregiver right away. You need to go to the hospital to get checked immediately.  HOME CARE INSTRUCTIONS    Avoid all  smoking, herbs, alcohol, and unprescribed drugs. These chemicals affect the formation and growth of the baby.   Follow your caregiver's instructions regarding medicine use. There are medicines that are either safe or unsafe to take during pregnancy.   Exercise only as directed by your caregiver. Experiencing uterine cramps is a good sign to stop exercising.   Continue to eat regular, healthy meals.   Wear a good support bra for breast tenderness.   Do not use hot tubs, steam rooms, or saunas.   Wear your seat belt at all times when driving.   Avoid raw meat, uncooked cheese, cat litter boxes, and soil used by cats. These carry germs that can cause birth defects in the baby.   Take your prenatal vitamins.   Try taking a stool softener (if your caregiver approves) if you develop constipation. Eat more high-fiber foods, such as fresh vegetables or fruit and whole grains. Drink plenty of fluids to keep your urine clear or pale yellow.   Take warm sitz baths to soothe any pain or discomfort caused by hemorrhoids. Use hemorrhoid cream if your caregiver approves.   If you develop varicose veins, wear support hose. Elevate your feet for 15 minutes, 3 4 times a day. Limit salt in your diet.   Avoid heavy lifting, wear low heal shoes, and practice good posture.   Rest a lot with your legs elevated if you have leg cramps or low back pain.   Visit your dentist if you have not gone during your pregnancy. Use a soft toothbrush to brush your teeth and be gentle when you floss.   A sexual relationship may be continued unless your caregiver directs you otherwise.   Do not travel far distances unless it is absolutely necessary and only with the approval of your caregiver.   Take prenatal classes to understand, practice, and ask questions about the labor and delivery.   Make a trial run to the hospital.   Pack your hospital bag.   Prepare the baby's nursery.   Continue to go to all your prenatal visits as directed  by your caregiver.  SEEK MEDICAL CARE IF:   You are unsure if you are in labor or if your water has broken.   You have dizziness.   You have mild pelvic cramps, pelvic pressure, or nagging pain in your abdominal area.   You have persistent nausea, vomiting, or diarrhea.   You have a bad smelling vaginal discharge.   You have pain with urination.  SEEK IMMEDIATE MEDICAL CARE IF:    You have a fever.   You are leaking fluid from your vagina.   You have spotting or bleeding from your vagina.     You have severe abdominal cramping or pain.   You have rapid weight loss or gain.   You have shortness of breath with chest pain.   You notice sudden or extreme swelling of your face, hands, ankles, feet, or legs.   You have not felt your baby move in over an hour.   You have severe headaches that do not go away with medicine.   You have vision changes.  Document Released: 03/18/2001 Document Revised: 11/24/2012 Document Reviewed: 05/25/2012  Exit[employer] Patient Information 2014 [employer].

## 2013-05-10 NOTE — Progress Notes (Signed)
S: 23 yo G1 @ 2077w2d here for ROBV - no ctx, lof, vb. +FM  O: see flowsheet  A/P - doing well - no concerns - PTL precautions discussed.

## 2013-05-10 NOTE — Progress Notes (Signed)
P=102  Pt needs prescription for prenatal vitamins

## 2013-05-25 ENCOUNTER — Encounter: Payer: Medicaid Other | Admitting: Obstetrics and Gynecology

## 2013-05-31 ENCOUNTER — Ambulatory Visit (INDEPENDENT_AMBULATORY_CARE_PROVIDER_SITE_OTHER): Payer: Medicaid Other | Admitting: Advanced Practice Midwife

## 2013-05-31 ENCOUNTER — Encounter: Payer: Self-pay | Admitting: Advanced Practice Midwife

## 2013-05-31 VITALS — BP 118/78 | Temp 97.0°F | Wt 153.1 lb

## 2013-05-31 DIAGNOSIS — Z3490 Encounter for supervision of normal pregnancy, unspecified, unspecified trimester: Secondary | ICD-10-CM

## 2013-05-31 DIAGNOSIS — O99332 Smoking (tobacco) complicating pregnancy, second trimester: Secondary | ICD-10-CM

## 2013-05-31 DIAGNOSIS — O99330 Smoking (tobacco) complicating pregnancy, unspecified trimester: Secondary | ICD-10-CM

## 2013-05-31 DIAGNOSIS — Z3400 Encounter for supervision of normal first pregnancy, unspecified trimester: Secondary | ICD-10-CM

## 2013-05-31 DIAGNOSIS — Z23 Encounter for immunization: Secondary | ICD-10-CM

## 2013-05-31 LAB — POCT URINALYSIS DIP (DEVICE)
BILIRUBIN URINE: NEGATIVE
GLUCOSE, UA: NEGATIVE mg/dL
Hgb urine dipstick: NEGATIVE
KETONES UR: NEGATIVE mg/dL
LEUKOCYTES UA: NEGATIVE
Nitrite: NEGATIVE
PROTEIN: NEGATIVE mg/dL
SPECIFIC GRAVITY, URINE: 1.015 (ref 1.005–1.030)
Urobilinogen, UA: 0.2 mg/dL (ref 0.0–1.0)
pH: 7.5 (ref 5.0–8.0)

## 2013-05-31 MED ORDER — TETANUS-DIPHTH-ACELL PERTUSSIS 5-2.5-18.5 LF-MCG/0.5 IM SUSP: 0.5000 mL | Freq: Once | INTRAMUSCULAR | Status: DC

## 2013-05-31 NOTE — Patient Instructions (Signed)
Third Trimester of Pregnancy  The third trimester is from week 29 through week 42, months 7 through 9. The third trimester is a time when the fetus is growing rapidly. At the end of the ninth month, the fetus is about 20 inches in length and weighs 6 10 pounds.   BODY CHANGES  Your body goes through many changes during pregnancy. The changes vary from woman to woman.    Your weight will continue to increase. You can expect to gain 25 35 pounds (11 16 kg) by the end of the pregnancy.   You may begin to get stretch marks on your hips, abdomen, and breasts.   You may urinate more often because the fetus is moving lower into your pelvis and pressing on your bladder.   You may develop or continue to have heartburn as a result of your pregnancy.   You may develop constipation because certain hormones are causing the muscles that push waste through your intestines to slow down.   You may develop hemorrhoids or swollen, bulging veins (varicose veins).   You may have pelvic pain because of the weight gain and pregnancy hormones relaxing your joints between the bones in your pelvis. Back aches may result from over exertion of the muscles supporting your posture.   Your breasts will continue to grow and be tender. A yellow discharge may leak from your breasts called colostrum.   Your belly button may stick out.   You may feel short of breath because of your expanding uterus.   You may notice the fetus "dropping," or moving lower in your abdomen.   You may have a bloody mucus discharge. This usually occurs a few days to a week before labor begins.   Your cervix becomes thin and soft (effaced) near your due date.  WHAT TO EXPECT AT YOUR PRENATAL EXAMS   You will have prenatal exams every 2 weeks until week 36. Then, you will have weekly prenatal exams. During a routine prenatal visit:   You will be weighed to make sure you and the fetus are growing normally.   Your blood pressure is taken.   Your abdomen will be  measured to track your baby's growth.   The fetal heartbeat will be listened to.   Any test results from the previous visit will be discussed.   You may have a cervical check near your due date to see if you have effaced.  At around 36 weeks, your caregiver will check your cervix. At the same time, your caregiver will also perform a test on the secretions of the vaginal tissue. This test is to determine if a type of bacteria, Group B streptococcus, is present. Your caregiver will explain this further.  Your caregiver may ask you:   What your birth plan is.   How you are feeling.   If you are feeling the baby move.   If you have had any abnormal symptoms, such as leaking fluid, bleeding, severe headaches, or abdominal cramping.   If you have any questions.  Other tests or screenings that may be performed during your third trimester include:   Blood tests that check for low iron levels (anemia).   Fetal testing to check the health, activity level, and growth of the fetus. Testing is done if you have certain medical conditions or if there are problems during the pregnancy.  FALSE LABOR  You may feel small, irregular contractions that eventually go away. These are called Braxton Hicks contractions, or   false labor. Contractions may last for hours, days, or even weeks before true labor sets in. If contractions come at regular intervals, intensify, or become painful, it is best to be seen by your caregiver.   SIGNS OF LABOR    Menstrual-like cramps.   Contractions that are 5 minutes apart or less.   Contractions that start on the top of the uterus and spread down to the lower abdomen and back.   A sense of increased pelvic pressure or back pain.   A watery or bloody mucus discharge that comes from the vagina.  If you have any of these signs before the 37th week of pregnancy, call your caregiver right away. You need to go to the hospital to get checked immediately.  HOME CARE INSTRUCTIONS    Avoid all  smoking, herbs, alcohol, and unprescribed drugs. These chemicals affect the formation and growth of the baby.   Follow your caregiver's instructions regarding medicine use. There are medicines that are either safe or unsafe to take during pregnancy.   Exercise only as directed by your caregiver. Experiencing uterine cramps is a good sign to stop exercising.   Continue to eat regular, healthy meals.   Wear a good support bra for breast tenderness.   Do not use hot tubs, steam rooms, or saunas.   Wear your seat belt at all times when driving.   Avoid raw meat, uncooked cheese, cat litter boxes, and soil used by cats. These carry germs that can cause birth defects in the baby.   Take your prenatal vitamins.   Try taking a stool softener (if your caregiver approves) if you develop constipation. Eat more high-fiber foods, such as fresh vegetables or fruit and whole grains. Drink plenty of fluids to keep your urine clear or pale yellow.   Take warm sitz baths to soothe any pain or discomfort caused by hemorrhoids. Use hemorrhoid cream if your caregiver approves.   If you develop varicose veins, wear support hose. Elevate your feet for 15 minutes, 3 4 times a day. Limit salt in your diet.   Avoid heavy lifting, wear low heal shoes, and practice good posture.   Rest a lot with your legs elevated if you have leg cramps or low back pain.   Visit your dentist if you have not gone during your pregnancy. Use a soft toothbrush to brush your teeth and be gentle when you floss.   A sexual relationship may be continued unless your caregiver directs you otherwise.   Do not travel far distances unless it is absolutely necessary and only with the approval of your caregiver.   Take prenatal classes to understand, practice, and ask questions about the labor and delivery.   Make a trial run to the hospital.   Pack your hospital bag.   Prepare the baby's nursery.   Continue to go to all your prenatal visits as directed  by your caregiver.  SEEK MEDICAL CARE IF:   You are unsure if you are in labor or if your water has broken.   You have dizziness.   You have mild pelvic cramps, pelvic pressure, or nagging pain in your abdominal area.   You have persistent nausea, vomiting, or diarrhea.   You have a bad smelling vaginal discharge.   You have pain with urination.  SEEK IMMEDIATE MEDICAL CARE IF:    You have a fever.   You are leaking fluid from your vagina.   You have spotting or bleeding from your vagina.     You have severe abdominal cramping or pain.   You have rapid weight loss or gain.   You have shortness of breath with chest pain.   You notice sudden or extreme swelling of your face, hands, ankles, feet, or legs.   You have not felt your baby move in over an hour.   You have severe headaches that do not go away with medicine.   You have vision changes.  Document Released: 03/18/2001 Document Revised: 11/24/2012 Document Reviewed: 05/25/2012  Exit[employer] Patient Information 2014 [employer].

## 2013-05-31 NOTE — Progress Notes (Signed)
Wants to wait until next week for cultures. Will check presentation then. Had ER doc tell her she was breech last month. Discussed contraception, breastfeeding.

## 2013-05-31 NOTE — Progress Notes (Signed)
P= 100 Mild edema in feet after standing for long periods of time.  Tdap today. Pt. Refused flu.

## 2013-06-10 ENCOUNTER — Ambulatory Visit (INDEPENDENT_AMBULATORY_CARE_PROVIDER_SITE_OTHER): Payer: Medicaid Other | Admitting: Obstetrics & Gynecology

## 2013-06-10 VITALS — BP 118/80 | Temp 97.9°F | Wt 158.5 lb

## 2013-06-10 DIAGNOSIS — Z3490 Encounter for supervision of normal pregnancy, unspecified, unspecified trimester: Secondary | ICD-10-CM

## 2013-06-10 DIAGNOSIS — O99330 Smoking (tobacco) complicating pregnancy, unspecified trimester: Secondary | ICD-10-CM

## 2013-06-10 LAB — POCT URINALYSIS DIP (DEVICE)
BILIRUBIN URINE: NEGATIVE
GLUCOSE, UA: NEGATIVE mg/dL
Hgb urine dipstick: NEGATIVE
KETONES UR: NEGATIVE mg/dL
Nitrite: NEGATIVE
Protein, ur: NEGATIVE mg/dL
SPECIFIC GRAVITY, URINE: 1.015 (ref 1.005–1.030)
Urobilinogen, UA: 0.2 mg/dL (ref 0.0–1.0)
pH: 6.5 (ref 5.0–8.0)

## 2013-06-10 LAB — OB RESULTS CONSOLE GBS: GBS: NEGATIVE

## 2013-06-10 LAB — OB RESULTS CONSOLE GC/CHLAMYDIA
CHLAMYDIA, DNA PROBE: NEGATIVE
GC PROBE AMP, GENITAL: NEGATIVE

## 2013-06-10 NOTE — Patient Instructions (Signed)
Return to clinic for any obstetric concerns or go to MAU for evaluation  

## 2013-06-10 NOTE — Progress Notes (Signed)
Pelvic cultures done today.  No other complaints or concerns.  Fetal movement and labor precautions reviewed. 

## 2013-06-10 NOTE — Progress Notes (Signed)
P=81 Pt reports clear discharge

## 2013-06-12 LAB — GC/CHLAMYDIA PROBE AMP
CT PROBE, AMP APTIMA: NEGATIVE
GC PROBE AMP APTIMA: NEGATIVE

## 2013-06-13 ENCOUNTER — Encounter: Payer: Self-pay | Admitting: Obstetrics & Gynecology

## 2013-06-13 LAB — CULTURE, STREPTOCOCCUS GRP B W/SUSCEPT

## 2013-06-15 ENCOUNTER — Ambulatory Visit (INDEPENDENT_AMBULATORY_CARE_PROVIDER_SITE_OTHER): Payer: Medicaid Other | Admitting: Obstetrics and Gynecology

## 2013-06-15 ENCOUNTER — Encounter: Payer: Self-pay | Admitting: Obstetrics and Gynecology

## 2013-06-15 VITALS — BP 121/80 | Temp 97.9°F | Wt 155.5 lb

## 2013-06-15 DIAGNOSIS — Z3480 Encounter for supervision of other normal pregnancy, unspecified trimester: Secondary | ICD-10-CM

## 2013-06-15 DIAGNOSIS — O99332 Smoking (tobacco) complicating pregnancy, second trimester: Secondary | ICD-10-CM

## 2013-06-15 DIAGNOSIS — Z3490 Encounter for supervision of normal pregnancy, unspecified, unspecified trimester: Secondary | ICD-10-CM

## 2013-06-15 DIAGNOSIS — O99330 Smoking (tobacco) complicating pregnancy, unspecified trimester: Secondary | ICD-10-CM

## 2013-06-15 LAB — POCT URINALYSIS DIP (DEVICE)
BILIRUBIN URINE: NEGATIVE
Glucose, UA: NEGATIVE mg/dL
Hgb urine dipstick: NEGATIVE
Ketones, ur: NEGATIVE mg/dL
Nitrite: NEGATIVE
Protein, ur: NEGATIVE mg/dL
Specific Gravity, Urine: 1.02 (ref 1.005–1.030)
UROBILINOGEN UA: 0.2 mg/dL (ref 0.0–1.0)
pH: 7 (ref 5.0–8.0)

## 2013-06-15 NOTE — Progress Notes (Signed)
Patient is doing well without complaints. FM/labor precautions reviewed. Culture results reviewed with patient

## 2013-06-15 NOTE — Progress Notes (Signed)
P= 78 C/o of intermittent lower abdominal/pelvic pressure. Edema in feet.

## 2013-06-24 ENCOUNTER — Encounter: Payer: Medicaid Other | Admitting: Family Medicine

## 2013-06-24 ENCOUNTER — Telehealth: Payer: Self-pay | Admitting: *Deleted

## 2013-06-24 NOTE — Telephone Encounter (Signed)
Pt called nurse line to notify the office that she lost her mucus plug.  Spoke with patient who denies any contractions, bleeding, or decrease in fetal movement.  Educated patient on the signs of labor and when to come to the hospital.  Pt verbalizes understanding.

## 2013-07-01 ENCOUNTER — Ambulatory Visit (INDEPENDENT_AMBULATORY_CARE_PROVIDER_SITE_OTHER): Payer: Medicaid Other | Admitting: Obstetrics & Gynecology

## 2013-07-01 VITALS — BP 131/89 | Temp 97.9°F | Wt 160.6 lb

## 2013-07-01 DIAGNOSIS — O99330 Smoking (tobacco) complicating pregnancy, unspecified trimester: Secondary | ICD-10-CM

## 2013-07-01 DIAGNOSIS — Z3480 Encounter for supervision of other normal pregnancy, unspecified trimester: Secondary | ICD-10-CM

## 2013-07-01 DIAGNOSIS — O99332 Smoking (tobacco) complicating pregnancy, second trimester: Secondary | ICD-10-CM

## 2013-07-01 DIAGNOSIS — Z3490 Encounter for supervision of normal pregnancy, unspecified, unspecified trimester: Secondary | ICD-10-CM

## 2013-07-01 DIAGNOSIS — K219 Gastro-esophageal reflux disease without esophagitis: Secondary | ICD-10-CM

## 2013-07-01 LAB — POCT URINALYSIS DIP (DEVICE)
Bilirubin Urine: NEGATIVE
Glucose, UA: NEGATIVE mg/dL
HGB URINE DIPSTICK: NEGATIVE
Ketones, ur: NEGATIVE mg/dL
NITRITE: NEGATIVE
PH: 6.5 (ref 5.0–8.0)
Protein, ur: NEGATIVE mg/dL
Specific Gravity, Urine: 1.01 (ref 1.005–1.030)
Urobilinogen, UA: 0.2 mg/dL (ref 0.0–1.0)

## 2013-07-01 MED ORDER — FAMOTIDINE 40 MG PO TABS: 40.0000 mg | ORAL_TABLET | Freq: Every day | ORAL | Status: DC

## 2013-07-01 NOTE — Progress Notes (Signed)
Pulse- 89 Edema-feet/vaginal  Pain-pelvic

## 2013-07-01 NOTE — Progress Notes (Signed)
Labor precautions given, membranes stripped.

## 2013-07-01 NOTE — Patient Instructions (Signed)

## 2013-07-06 ENCOUNTER — Ambulatory Visit (INDEPENDENT_AMBULATORY_CARE_PROVIDER_SITE_OTHER): Payer: Medicaid Other | Admitting: *Deleted

## 2013-07-06 VITALS — BP 121/75

## 2013-07-06 DIAGNOSIS — O480 Post-term pregnancy: Secondary | ICD-10-CM

## 2013-07-06 LAB — US OB FOLLOW UP

## 2013-07-06 NOTE — Progress Notes (Signed)
P = 60   IOL scheduled 4/6 @ 0730.  Pt advised to return to hospital for sx of labor, heavy vaginal bleeding or decreased FM.

## 2013-07-06 NOTE — Progress Notes (Signed)
NST reviewed and reactive.  Lashane Whelpley L. Harraway-Smith, M.D., FACOG    

## 2013-07-07 ENCOUNTER — Encounter (HOSPITAL_COMMUNITY): Payer: Self-pay | Admitting: *Deleted

## 2013-07-07 ENCOUNTER — Inpatient Hospital Stay (HOSPITAL_COMMUNITY)
Admission: AD | Admit: 2013-07-07 | Discharge: 2013-07-07 | Disposition: A | Discharge status: Home / Self Care | Payer: Medicaid Other | Source: Ambulatory Visit | Attending: Obstetrics & Gynecology | Admitting: Obstetrics & Gynecology

## 2013-07-07 ENCOUNTER — Telehealth (HOSPITAL_COMMUNITY): Payer: Self-pay | Admitting: *Deleted

## 2013-07-07 DIAGNOSIS — O479 False labor, unspecified: Secondary | ICD-10-CM | POA: Insufficient documentation

## 2013-07-07 DIAGNOSIS — Z3490 Encounter for supervision of normal pregnancy, unspecified, unspecified trimester: Secondary | ICD-10-CM

## 2013-07-07 DIAGNOSIS — O99332 Smoking (tobacco) complicating pregnancy, second trimester: Secondary | ICD-10-CM

## 2013-07-07 NOTE — Telephone Encounter (Signed)
Preadmission screen  

## 2013-07-07 NOTE — Discharge Instructions (Signed)

## 2013-07-07 NOTE — MAU Note (Signed)
PT SAYS SHE GETS PNC - DOWNSTAIRS-  WAS CHECKED   THIS WEEK   1 CM,  DENIES HSV AND MRSA.
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
## 2013-07-08 NOTE — MAU Provider Note (Signed)
Attestation of Attending Supervision of Advanced Practitioner (CNM/NP): Evaluation and management procedures were performed by the Advanced Practitioner under my supervision and collaboration.  I have reviewed the Advanced Practitioner's note and chart, and I agree with the management and plan.  HARRAWAY-SMITH, Tyvon Eggenberger 12:06 AM     

## 2013-07-09 ENCOUNTER — Encounter (HOSPITAL_COMMUNITY): Payer: Self-pay | Admitting: *Deleted

## 2013-07-09 ENCOUNTER — Inpatient Hospital Stay (HOSPITAL_COMMUNITY)
Admission: AD | Admit: 2013-07-09 | Discharge: 2013-07-11 | DRG: 775 | Disposition: A | Discharge status: Home / Self Care | Payer: Medicaid Other | Source: Ambulatory Visit | Attending: Obstetrics and Gynecology | Admitting: Obstetrics and Gynecology

## 2013-07-09 DIAGNOSIS — J45909 Unspecified asthma, uncomplicated: Secondary | ICD-10-CM | POA: Diagnosis present

## 2013-07-09 DIAGNOSIS — F1210 Cannabis abuse, uncomplicated: Secondary | ICD-10-CM | POA: Diagnosis present

## 2013-07-09 DIAGNOSIS — Z3490 Encounter for supervision of normal pregnancy, unspecified, unspecified trimester: Secondary | ICD-10-CM

## 2013-07-09 DIAGNOSIS — O9989 Other specified diseases and conditions complicating pregnancy, childbirth and the puerperium: Secondary | ICD-10-CM

## 2013-07-09 DIAGNOSIS — O99332 Smoking (tobacco) complicating pregnancy, second trimester: Secondary | ICD-10-CM

## 2013-07-09 DIAGNOSIS — O99892 Other specified diseases and conditions complicating childbirth: Secondary | ICD-10-CM | POA: Diagnosis present

## 2013-07-09 DIAGNOSIS — O99344 Other mental disorders complicating childbirth: Secondary | ICD-10-CM | POA: Diagnosis present

## 2013-07-09 DIAGNOSIS — F319 Bipolar disorder, unspecified: Secondary | ICD-10-CM | POA: Diagnosis present

## 2013-07-09 DIAGNOSIS — O99334 Smoking (tobacco) complicating childbirth: Secondary | ICD-10-CM | POA: Diagnosis present

## 2013-07-09 DIAGNOSIS — IMO0001 Reserved for inherently not codable concepts without codable children: Secondary | ICD-10-CM

## 2013-07-09 NOTE — MAU Note (Signed)
PT SAYS SROM AT 2330- CLEAR.   WAS IN MAU- Thursday-  1 CM.   DENIES HSV AND MRSA.

## 2013-07-10 ENCOUNTER — Inpatient Hospital Stay (HOSPITAL_COMMUNITY): Payer: Medicaid Other | Admitting: Anesthesiology

## 2013-07-10 ENCOUNTER — Encounter (HOSPITAL_COMMUNITY): Payer: Medicaid Other | Admitting: Anesthesiology

## 2013-07-10 ENCOUNTER — Encounter (HOSPITAL_COMMUNITY): Payer: Self-pay | Admitting: *Deleted

## 2013-07-10 DIAGNOSIS — F1210 Cannabis abuse, uncomplicated: Secondary | ICD-10-CM

## 2013-07-10 DIAGNOSIS — F319 Bipolar disorder, unspecified: Secondary | ICD-10-CM

## 2013-07-10 DIAGNOSIS — O99344 Other mental disorders complicating childbirth: Secondary | ICD-10-CM

## 2013-07-10 DIAGNOSIS — IMO0001 Reserved for inherently not codable concepts without codable children: Secondary | ICD-10-CM

## 2013-07-10 LAB — CBC
HEMATOCRIT: 36.9 % (ref 36.0–46.0)
HEMOGLOBIN: 13 g/dL (ref 12.0–15.0)
MCH: 31.4 pg (ref 26.0–34.0)
MCHC: 35.2 g/dL (ref 30.0–36.0)
MCV: 89.1 fL (ref 78.0–100.0)
Platelets: 194 10*3/uL (ref 150–400)
RBC: 4.14 MIL/uL (ref 3.87–5.11)
RDW: 13.5 % (ref 11.5–15.5)
WBC: 13.4 10*3/uL — AB (ref 4.0–10.5)

## 2013-07-10 LAB — RPR: RPR: NONREACTIVE

## 2013-07-10 LAB — TYPE AND SCREEN
ABO/RH(D): A POS
Antibody Screen: NEGATIVE

## 2013-07-10 LAB — ABO/RH: ABO/RH(D): A POS

## 2013-07-10 MED ORDER — BENZOCAINE-MENTHOL 20-0.5 % EX AERO
1.0000 "application " | INHALATION_SPRAY | CUTANEOUS | Status: DC | PRN
  Filled 2013-07-10: qty 56

## 2013-07-10 MED ORDER — ONDANSETRON HCL 4 MG/2ML IJ SOLN: 4.0000 mg | INTRAMUSCULAR | Status: DC | PRN

## 2013-07-10 MED ORDER — FENTANYL 2.5 MCG/ML BUPIVACAINE 1/10 % EPIDURAL INFUSION (WH - ANES)
INTRAMUSCULAR | Status: DC | PRN
  Administered 2013-07-10: 14 mL/h via EPIDURAL

## 2013-07-10 MED ORDER — OXYCODONE-ACETAMINOPHEN 5-325 MG PO TABS: 1.0000 | ORAL_TABLET | ORAL | Status: DC | PRN

## 2013-07-10 MED ORDER — FENTANYL 2.5 MCG/ML BUPIVACAINE 1/10 % EPIDURAL INFUSION (WH - ANES)
14.0000 mL/h | INTRAMUSCULAR | Status: DC | PRN
  Filled 2013-07-10: qty 125

## 2013-07-10 MED ORDER — IBUPROFEN 600 MG PO TABS
600.0000 mg | ORAL_TABLET | Freq: Four times a day (QID) | ORAL | Status: DC
  Administered 2013-07-10 – 2013-07-11 (×4): 600 mg via ORAL
  Filled 2013-07-10 (×5): qty 1

## 2013-07-10 MED ORDER — DIPHENHYDRAMINE HCL 25 MG PO CAPS: 25.0000 mg | ORAL_CAPSULE | Freq: Four times a day (QID) | ORAL | Status: DC | PRN

## 2013-07-10 MED ORDER — ACETAMINOPHEN 325 MG PO TABS: 650.0000 mg | ORAL_TABLET | ORAL | Status: DC | PRN

## 2013-07-10 MED ORDER — LACTATED RINGERS IV SOLN
INTRAVENOUS | Status: DC
Start: 2013-07-10 — End: 2013-07-11
  Administered 2013-07-10 (×2): via INTRAVENOUS

## 2013-07-10 MED ORDER — ZOLPIDEM TARTRATE 5 MG PO TABS: 5.0000 mg | ORAL_TABLET | Freq: Every evening | ORAL | Status: DC | PRN

## 2013-07-10 MED ORDER — WITCH HAZEL-GLYCERIN EX PADS
1.0000 | MEDICATED_PAD | CUTANEOUS | Status: DC | PRN
Start: 2013-07-10 — End: 2013-07-11

## 2013-07-10 MED ORDER — LANOLIN HYDROUS EX OINT: TOPICAL_OINTMENT | CUTANEOUS | Status: DC | PRN

## 2013-07-10 MED ORDER — HYDROXYZINE HCL 50 MG PO TABS
50.0000 mg | ORAL_TABLET | Freq: Four times a day (QID) | ORAL | Status: DC | PRN
Start: 2013-07-10 — End: 2013-07-11
  Filled 2013-07-10: qty 1

## 2013-07-10 MED ORDER — IBUPROFEN 600 MG PO TABS
600.0000 mg | ORAL_TABLET | Freq: Four times a day (QID) | ORAL | Status: DC | PRN
  Administered 2013-07-11: 600 mg via ORAL

## 2013-07-10 MED ORDER — OXYTOCIN BOLUS FROM INFUSION
500.0000 mL | INTRAVENOUS | Status: DC
Start: 2013-07-10 — End: 2013-07-11
  Administered 2013-07-10: 500 mL via INTRAVENOUS

## 2013-07-10 MED ORDER — OXYTOCIN 40 UNITS IN LACTATED RINGERS INFUSION - SIMPLE MED
62.5000 mL/h | INTRAVENOUS | Status: DC
Start: 2013-07-10 — End: 2013-07-11
  Filled 2013-07-10: qty 1000

## 2013-07-10 MED ORDER — CITRIC ACID-SODIUM CITRATE 334-500 MG/5ML PO SOLN
30.0000 mL | ORAL | Status: DC | PRN
Start: 2013-07-10 — End: 2013-07-11

## 2013-07-10 MED ORDER — ONDANSETRON HCL 4 MG PO TABS: 4.0000 mg | ORAL_TABLET | ORAL | Status: DC | PRN

## 2013-07-10 MED ORDER — PRENATAL MULTIVITAMIN CH
1.0000 | ORAL_TABLET | Freq: Every day | ORAL | Status: DC
  Administered 2013-07-10 – 2013-07-11 (×2): 1 via ORAL
  Filled 2013-07-10 (×2): qty 1

## 2013-07-10 MED ORDER — ONDANSETRON HCL 4 MG/2ML IJ SOLN
4.0000 mg | Freq: Four times a day (QID) | INTRAMUSCULAR | Status: DC | PRN
Start: 2013-07-10 — End: 2013-07-11

## 2013-07-10 MED ORDER — LIDOCAINE HCL (PF) 1 % IJ SOLN
INTRAMUSCULAR | Status: DC | PRN
  Administered 2013-07-10 (×2): 8 mL

## 2013-07-10 MED ORDER — LACTATED RINGERS IV SOLN
500.0000 mL | Freq: Once | INTRAVENOUS | Status: AC
  Administered 2013-07-10: 500 mL via INTRAVENOUS

## 2013-07-10 MED ORDER — PHENYLEPHRINE 40 MCG/ML (10ML) SYRINGE FOR IV PUSH (FOR BLOOD PRESSURE SUPPORT)
80.0000 ug | PREFILLED_SYRINGE | INTRAVENOUS | Status: DC | PRN
  Filled 2013-07-10: qty 2
  Filled 2013-07-10: qty 10

## 2013-07-10 MED ORDER — TETANUS-DIPHTH-ACELL PERTUSSIS 5-2.5-18.5 LF-MCG/0.5 IM SUSP: 0.5000 mL | Freq: Once | INTRAMUSCULAR | Status: DC

## 2013-07-10 MED ORDER — FLEET ENEMA 7-19 GM/118ML RE ENEM: 1.0000 | ENEMA | RECTAL | Status: DC | PRN

## 2013-07-10 MED ORDER — LACTATED RINGERS IV SOLN: 500.0000 mL | INTRAVENOUS | Status: DC | PRN

## 2013-07-10 MED ORDER — LIDOCAINE HCL (PF) 1 % IJ SOLN
30.0000 mL | INTRAMUSCULAR | Status: DC | PRN
  Filled 2013-07-10: qty 30

## 2013-07-10 MED ORDER — DIPHENHYDRAMINE HCL 50 MG/ML IJ SOLN: 12.5000 mg | INTRAMUSCULAR | Status: DC | PRN

## 2013-07-10 MED ORDER — EPHEDRINE 5 MG/ML INJ
10.0000 mg | INTRAVENOUS | Status: DC | PRN
  Filled 2013-07-10: qty 4
  Filled 2013-07-10: qty 2

## 2013-07-10 MED ORDER — DIBUCAINE 1 % RE OINT
1.0000 "application " | TOPICAL_OINTMENT | RECTAL | Status: DC | PRN
  Filled 2013-07-10: qty 28

## 2013-07-10 MED ORDER — PHENYLEPHRINE 40 MCG/ML (10ML) SYRINGE FOR IV PUSH (FOR BLOOD PRESSURE SUPPORT)
80.0000 ug | PREFILLED_SYRINGE | INTRAVENOUS | Status: DC | PRN
  Filled 2013-07-10: qty 2

## 2013-07-10 MED ORDER — SIMETHICONE 80 MG PO CHEW: 80.0000 mg | CHEWABLE_TABLET | ORAL | Status: DC | PRN

## 2013-07-10 MED ORDER — EPHEDRINE 5 MG/ML INJ
10.0000 mg | INTRAVENOUS | Status: DC | PRN
  Filled 2013-07-10: qty 2

## 2013-07-10 MED ORDER — SENNOSIDES-DOCUSATE SODIUM 8.6-50 MG PO TABS
2.0000 | ORAL_TABLET | ORAL | Status: DC
  Administered 2013-07-11: 2 via ORAL
  Filled 2013-07-10: qty 2

## 2013-07-10 NOTE — Lactation Note (Signed)
This note was copied from the chart of Girl Metro KungCourtney Clyatt. Lactation Consultation Note  Patient Name: Girl Metro KungCourtney Reali RUEAV'WToday's Date: 07/10/2013  Mom reports baby has latched well a few times, denies discomfort. BF basics reviewed with Mom. Continue que base feedings. Lactation brochure left for review, advised of OP services and support group. Encouraged to call if would like assist.    Maternal Data Formula Feeding for Exclusion: No Infant to breast within first hour of birth: Yes Has patient been taught Hand Expression?: Yes Does the patient have breastfeeding experience prior to this delivery?: No  Feeding Feeding Type: Breast Fed Length of feed: 0 min  LATCH Score/Interventions                      Lactation Tools Discussed/Used     Consult Status Consult Status: Follow-up Date: 07/11/13 Follow-up type: In-patient    Alfred LevinsGranger, Isam Unrein Ann 07/10/2013, 8:47 PM

## 2013-07-10 NOTE — Anesthesia Preprocedure Evaluation (Signed)
Anesthesia Evaluation  Patient identified by MRN, date of birth, ID band Patient awake    Reviewed: Allergy & Precautions, H&P , NPO status , Patient's Chart, lab work & pertinent test results  Airway Mallampati: I TM Distance: >3 FB Neck ROM: full    Dental no notable dental hx.    Pulmonary Current Smoker,    Pulmonary exam normal       Cardiovascular negative cardio ROS      Neuro/Psych Bipolar Disorder negative neurological ROS     GI/Hepatic negative GI ROS, Neg liver ROS,   Endo/Other  negative endocrine ROS  Renal/GU negative Renal ROS     Musculoskeletal   Abdominal Normal abdominal exam  (+)   Peds  Hematology negative hematology ROS (+)   Anesthesia Other Findings   Reproductive/Obstetrics (+) Pregnancy                           Anesthesia Physical Anesthesia Plan  ASA: II  Anesthesia Plan: Epidural   Post-op Pain Management:    Induction:   Airway Management Planned:   Additional Equipment:   Intra-op Plan:   Post-operative Plan:   Informed Consent: I have reviewed the patients History and Physical, chart, labs and discussed the procedure including the risks, benefits and alternatives for the proposed anesthesia with the patient or authorized representative who has indicated his/her understanding and acceptance.     Plan Discussed with:   Anesthesia Plan Comments:         Anesthesia Quick Evaluation

## 2013-07-10 NOTE — Anesthesia Postprocedure Evaluation (Signed)
Anesthesia Post Note  Patient: [name]  Procedure(s) Performed: * No procedures listed *  Anesthesia type: Epidural  Patient location: Mother/Baby  Post pain: Pain level controlled  Post assessment: Post-op Vital signs reviewed  Last Vitals:  Filed Vitals:   07/10/13 1330  BP: 118/74  Pulse: 77  Temp: 36.7 C  Resp: 16    Post vital signs: Reviewed  Level of consciousness:alert  Complications: No apparent anesthesia complications

## 2013-07-10 NOTE — Anesthesia Procedure Notes (Signed)
Epidural Patient location during procedure: OB Start time: 07/10/2013 1:37 AM End time: 07/10/2013 1:41 AM  Staffing Anesthesiologist: Leilani AbleHATCHETT, Kaisei Gilbo Performed by: anesthesiologist   Preanesthetic Checklist Completed: patient identified, surgical consent, pre-op evaluation, timeout performed, IV checked, risks and benefits discussed and monitors and equipment checked  Epidural Patient position: sitting Prep: site prepped and draped and DuraPrep Patient monitoring: continuous pulse ox and blood pressure Approach: midline Location: L3-L4 Injection technique: LOR air  Needle:  Needle type: Tuohy  Needle gauge: 17 G Needle length: 9 cm and 9 Needle insertion depth: 6 cm Catheter type: closed end flexible Catheter size: 19 Gauge Catheter at skin depth: 11 cm Test dose: negative and Other  Assessment Sensory level: T9 Events: blood not aspirated, injection not painful, no injection resistance, negative IV test and no paresthesia  Additional Notes Reason for block:procedure for pain

## 2013-07-10 NOTE — H&P (Signed)
Attestation of Attending Supervision of Advanced Practitioner (PA/CNM/NP): Evaluation and management procedures were performed by the Advanced Practitioner under my supervision and collaboration.  I have reviewed the Advanced Practitioner's note and chart, and I agree with the management and plan.  Reva BoresPRATT,Moneisha Vosler S, MD [facility] Attending 07/10/2013 7:11 AM
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
## 2013-07-11 ENCOUNTER — Inpatient Hospital Stay (HOSPITAL_COMMUNITY): Admission: RE | Admit: 2013-07-11 | Payer: Medicaid Other | Source: Ambulatory Visit

## 2013-07-11 LAB — CBC
HCT: 31.6 % — ABNORMAL LOW (ref 36.0–46.0)
Hemoglobin: 10.7 g/dL — ABNORMAL LOW (ref 12.0–15.0)
MCH: 30.9 pg (ref 26.0–34.0)
MCHC: 33.9 g/dL (ref 30.0–36.0)
MCV: 91.3 fL (ref 78.0–100.0)
Platelets: 141 10*3/uL — ABNORMAL LOW (ref 150–400)
RBC: 3.46 MIL/uL — ABNORMAL LOW (ref 3.87–5.11)
RDW: 14 % (ref 11.5–15.5)
WBC: 11 10*3/uL — ABNORMAL HIGH (ref 4.0–10.5)

## 2013-07-11 MED ORDER — IBUPROFEN 600 MG PO TABS: 600.0000 mg | ORAL_TABLET | Freq: Four times a day (QID) | ORAL | Status: DC | PRN

## 2013-07-11 NOTE — Progress Notes (Signed)
Ur chart review completed.  

## 2013-07-11 NOTE — Discharge Instructions (Signed)

## 2013-07-11 NOTE — Discharge Summary (Signed)
Obstetric Discharge Summary Reason for Admission: onset of labor Prenatal Procedures: NST and ultrasound Intrapartum Procedures: spontaneous vaginal delivery Postpartum Procedures: none Complications-Operative and Postpartum: none 1st degree lac Hemoglobin  Date Value Ref Range Status  07/10/2013 13.0  12.0 - 15.0 g/dL Final     HCT  Date Value Ref Range Status  07/10/2013 36.9  36.0 - 46.0 % Final    Physical Exam:  General: alert, cooperative and no distress Lochia: appropriate Uterine Fundus: firm Incision: n/a DVT Evaluation: No evidence of DVT seen on physical exam.  Discharge Diagnoses: Term Pregnancy-delivered  G1P1001 s/P uncomplicated NSVD at 41 wks  Discharge Information: Date: 07/11/2013 Activity: per booklet Diet: routine Medications: PNV and Ibuprofen Condition: stable Instructions: refer to practice specific booklet Discharge to: home Follow-up Information   Follow up with [facility]. Schedule an appointment as soon as possible for a visit in 4 weeks.   Contact information:   [address] [place] [address] [phone]       Newborn Data: Live born female  Birth Weight: 6 lb 10.9 oz (3031 g) APGAR: 9, 9  Home with mother.  Miski Feldpausch 07/11/2013, 6:21 AM

## 2013-07-12 NOTE — Discharge Summary (Signed)
Attestation of Attending Supervision of Advanced Practitioner: Evaluation and management procedures were performed by the PA/NP/CNM/OB Fellow under my supervision/collaboration. Chart reviewed and agree with management and plan.  Jazzlene Huot V 07/12/2013 11:02 PM

## 2013-08-15 ENCOUNTER — Encounter: Payer: Self-pay | Admitting: Family Medicine

## 2013-08-15 ENCOUNTER — Ambulatory Visit (INDEPENDENT_AMBULATORY_CARE_PROVIDER_SITE_OTHER): Payer: Medicaid Other | Admitting: Family Medicine

## 2013-08-15 VITALS — BP 112/71 | HR 77 | Ht 63.0 in | Wt 134.4 lb

## 2013-08-15 DIAGNOSIS — Z720 Tobacco use: Secondary | ICD-10-CM | POA: Insufficient documentation

## 2013-08-15 DIAGNOSIS — F17200 Nicotine dependence, unspecified, uncomplicated: Secondary | ICD-10-CM

## 2013-08-15 NOTE — Progress Notes (Signed)
Pt desires mirena for birth control. She had unprotected intercourse 3-4 days ago.

## 2013-08-15 NOTE — Patient Instructions (Signed)

## 2013-08-15 NOTE — Progress Notes (Signed)
Subjective:     [name] is a 23 y.o. female who presents for a postpartum visit. She is 4 weeks postpartum following a spontaneous vaginal delivery. I have fully reviewed the prenatal and intrapartum course. The delivery was at 41 gestational weeks. Outcome: spontaneous vaginal delivery. Anesthesia: epidural. Postpartum course has been uncomplicated except for painful unprotectec intercourse 3 days. Baby's course has been uncomplicated. Baby is feeding by breast. Bleeding no bleeding. Bowel function is normal. Bladder function is normal. Patient is sexually active. Contraception method is IUD. Postpartum depression screening: negative.  The following portions of the patient's history were reviewed and updated as appropriate: allergies, current medications, past family history, past medical history, past social history, past surgical history and problem list.  Review of Systems Pertinent items are noted in HPI.   Objective:    BP 112/71  Pulse 77  Ht 5\' 3"  (1.6 m)  Wt 60.963 kg (134 lb 6.4 oz)  BMI 23.81 kg/m2  Breastfeeding? Yes  General:  alert, cooperative, appears stated age and no distress   Breasts:  Declined  Lungs: clear to auscultation bilaterally and normal percussion bilaterally  Heart:  regular rate and rhythm, S1, S2 normal, no murmur, click, rub or gallop  Abdomen: soft, non-tender; bowel sounds normal; no masses,  no organomegaly   Vulva:  normal  Vagina: normal vagina, removed 2 stitches  Cervix:  Not examined  Corpus:   Adnexa:  not evaluated  Rectal Exam: Not performed.        Assessment:     4 postpartum exam. Pap smear not done at today's visit.   Plan:    1. Contraception: IUD return next week for IUD, intercourse 3d unprotected 2. Breast feeding no acute issues 3. Follow up in: 1 week or as needed.  4. No sign of PPD

## 2013-08-22 ENCOUNTER — Telehealth: Payer: Self-pay

## 2013-08-22 ENCOUNTER — Ambulatory Visit: Payer: Medicaid Other | Admitting: Obstetrics & Gynecology

## 2013-08-22 NOTE — Telephone Encounter (Signed)
Pt. Missed today's appointment for IUD insertion. Attempted to call pt.'s home phone number: has been disconnected. Attempted to call patient's mobile number. No answer. Left message statign we are sorry you missed your appointment, please call clinic to re-schedule.

## 2013-11-28 ENCOUNTER — Encounter (HOSPITAL_BASED_OUTPATIENT_CLINIC_OR_DEPARTMENT_OTHER): Payer: Self-pay | Admitting: Emergency Medicine

## 2013-11-28 ENCOUNTER — Emergency Department (HOSPITAL_BASED_OUTPATIENT_CLINIC_OR_DEPARTMENT_OTHER)
Admission: EM | Admit: 2013-11-28 | Discharge: 2013-11-28 | Disposition: A | Discharge status: Home / Self Care | Payer: Medicaid Other | Attending: Emergency Medicine | Admitting: Emergency Medicine

## 2013-11-28 DIAGNOSIS — F17200 Nicotine dependence, unspecified, uncomplicated: Secondary | ICD-10-CM | POA: Diagnosis not present

## 2013-11-28 DIAGNOSIS — R21 Rash and other nonspecific skin eruption: Secondary | ICD-10-CM | POA: Insufficient documentation

## 2013-11-28 DIAGNOSIS — J45909 Unspecified asthma, uncomplicated: Secondary | ICD-10-CM | POA: Insufficient documentation

## 2013-11-28 DIAGNOSIS — Z791 Long term (current) use of non-steroidal anti-inflammatories (NSAID): Secondary | ICD-10-CM | POA: Diagnosis not present

## 2013-11-28 DIAGNOSIS — Z79899 Other long term (current) drug therapy: Secondary | ICD-10-CM | POA: Diagnosis not present

## 2013-11-28 DIAGNOSIS — L259 Unspecified contact dermatitis, unspecified cause: Secondary | ICD-10-CM | POA: Diagnosis not present

## 2013-11-28 DIAGNOSIS — Z8659 Personal history of other mental and behavioral disorders: Secondary | ICD-10-CM | POA: Diagnosis not present

## 2013-11-28 MED ORDER — PREDNISONE 10 MG PO TABS: ORAL_TABLET | ORAL | Status: DC

## 2013-11-28 NOTE — ED Provider Notes (Signed)
Medical screening examination/treatment/procedure(s) were performed by non-physician practitioner and as supervising physician I was immediately available for consultation/collaboration.     Geoffery Lyons, MD 11/28/13 1500
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
## 2013-11-28 NOTE — ED Notes (Addendum)
Pt reports she, her husband and her baby all have a rash.

## 2013-11-28 NOTE — Discharge Instructions (Signed)
Contact Dermatitis °Contact dermatitis is a rash that happens when something touches the skin. You touched something that irritates your skin, or you have allergies to something you touched. °HOME CARE  °· Avoid the thing that caused your rash. °· Keep your rash away from hot water, soap, sunlight, chemicals, and other things that might bother it. °· Do not scratch your rash. °· You can take cool baths to help stop itching. °· Only take medicine as told by your doctor. °· Keep all doctor visits as told. °GET HELP RIGHT AWAY IF:  °· Your rash is not better after 3 days. °· Your rash gets worse. °· Your rash is puffy (swollen), tender, red, sore, or warm. °· You have problems with your medicine. °MAKE SURE YOU:  °· Understand these instructions. °· Will watch your condition. °· Will get help right away if you are not doing well or get worse. °Document Released: 01/19/2009 Document Revised: 06/16/2011 Document Reviewed: 08/27/2010 °[employer]® Patient Information ©2015 [employer]. This information is not intended to replace advice given to you by your health care provider. Make sure you discuss any questions you have with your health care provider. ° °

## 2014-02-06 ENCOUNTER — Encounter (HOSPITAL_BASED_OUTPATIENT_CLINIC_OR_DEPARTMENT_OTHER): Payer: Self-pay | Admitting: Emergency Medicine

## 2014-03-09 ENCOUNTER — Encounter: Payer: Self-pay | Admitting: Obstetrics & Gynecology

## 2015-04-08 NOTE — L&D Delivery Note (Signed)
Delivery Note At  a viable female was delivered via  (Presentation:LOA ;  ).  APGAR:9 ,9 ; weight pending  .   Placenta status:complete , .3V  Cord:  with the following complications:none .    Anesthesia:  Epidural Episiotomy:  None Lacerations:  2nd Suture Repair: 3.0 vicryl Est. Blood Loss (mL):  150cc  Mom to postpartum.  Baby to Couplet care / Skin to Skin.  [name] 02/17/2016, 9:22 AM

## 2015-05-03 LAB — OB RESULTS CONSOLE GBS: STREP GROUP B AG: NEGATIVE

## 2015-07-17 LAB — OB RESULTS CONSOLE GC/CHLAMYDIA
Chlamydia: NEGATIVE
GC PROBE AMP, GENITAL: NEGATIVE

## 2015-07-17 LAB — OB RESULTS CONSOLE RUBELLA ANTIBODY, IGM: Rubella: IMMUNE

## 2015-07-17 LAB — OB RESULTS CONSOLE HEPATITIS B SURFACE ANTIGEN: Hepatitis B Surface Ag: NEGATIVE

## 2015-07-17 LAB — OB RESULTS CONSOLE ABO/RH: RH Type: POSITIVE

## 2015-07-17 LAB — OB RESULTS CONSOLE ANTIBODY SCREEN: ANTIBODY SCREEN: NEGATIVE

## 2015-07-17 LAB — OB RESULTS CONSOLE RPR: RPR: NONREACTIVE

## 2015-07-17 LAB — OB RESULTS CONSOLE HIV ANTIBODY (ROUTINE TESTING): HIV: NONREACTIVE

## 2015-09-06 ENCOUNTER — Telehealth: Payer: Self-pay | Admitting: *Deleted

## 2015-09-06 DIAGNOSIS — Z3201 Encounter for pregnancy test, result positive: Secondary | ICD-10-CM

## 2015-09-06 NOTE — Telephone Encounter (Signed)
Per Dr. Adrian BlackwaterStinson patient needs ultrasound before new ob for dating and viability. Ultrasound ordered for 09/14/15 at 1445.

## 2015-09-06 NOTE — Telephone Encounter (Signed)
Called patient and left message with ultrasound details.

## 2015-09-12 ENCOUNTER — Emergency Department (HOSPITAL_COMMUNITY): Payer: Medicaid Other

## 2015-09-12 ENCOUNTER — Encounter (HOSPITAL_COMMUNITY): Payer: Self-pay | Admitting: Emergency Medicine

## 2015-09-12 ENCOUNTER — Emergency Department (HOSPITAL_COMMUNITY)
Admission: EM | Admit: 2015-09-12 | Discharge: 2015-09-12 | Disposition: A | Discharge status: Home / Self Care | Payer: Medicaid Other | Attending: Emergency Medicine | Admitting: Emergency Medicine

## 2015-09-12 DIAGNOSIS — O23592 Infection of other part of genital tract in pregnancy, second trimester: Secondary | ICD-10-CM | POA: Diagnosis not present

## 2015-09-12 DIAGNOSIS — O99332 Smoking (tobacco) complicating pregnancy, second trimester: Secondary | ICD-10-CM | POA: Diagnosis not present

## 2015-09-12 DIAGNOSIS — Z3490 Encounter for supervision of normal pregnancy, unspecified, unspecified trimester: Secondary | ICD-10-CM

## 2015-09-12 DIAGNOSIS — R1030 Lower abdominal pain, unspecified: Secondary | ICD-10-CM | POA: Insufficient documentation

## 2015-09-12 DIAGNOSIS — Z3A16 16 weeks gestation of pregnancy: Secondary | ICD-10-CM | POA: Insufficient documentation

## 2015-09-12 DIAGNOSIS — O26892 Other specified pregnancy related conditions, second trimester: Secondary | ICD-10-CM | POA: Insufficient documentation

## 2015-09-12 DIAGNOSIS — F17210 Nicotine dependence, cigarettes, uncomplicated: Secondary | ICD-10-CM | POA: Diagnosis not present

## 2015-09-12 DIAGNOSIS — B9689 Other specified bacterial agents as the cause of diseases classified elsewhere: Secondary | ICD-10-CM | POA: Diagnosis not present

## 2015-09-12 DIAGNOSIS — N760 Acute vaginitis: Secondary | ICD-10-CM

## 2015-09-12 DIAGNOSIS — R109 Unspecified abdominal pain: Secondary | ICD-10-CM

## 2015-09-12 DIAGNOSIS — O26899 Other specified pregnancy related conditions, unspecified trimester: Secondary | ICD-10-CM

## 2015-09-12 LAB — CBC WITH DIFFERENTIAL/PLATELET
BASOS ABS: 0 10*3/uL (ref 0.0–0.1)
Basophils Relative: 0 %
Eosinophils Absolute: 0.1 10*3/uL (ref 0.0–0.7)
Eosinophils Relative: 1 %
HCT: 33.6 % — ABNORMAL LOW (ref 36.0–46.0)
Hemoglobin: 12 g/dL (ref 12.0–15.0)
LYMPHS ABS: 1.7 10*3/uL (ref 0.7–4.0)
Lymphocytes Relative: 22 %
MCH: 30.2 pg (ref 26.0–34.0)
MCHC: 35.7 g/dL (ref 30.0–36.0)
MCV: 84.4 fL (ref 78.0–100.0)
MONO ABS: 0.5 10*3/uL (ref 0.1–1.0)
Monocytes Relative: 6 %
NEUTROS ABS: 5.7 10*3/uL (ref 1.7–7.7)
Neutrophils Relative %: 71 %
PLATELETS: 208 10*3/uL (ref 150–400)
RBC: 3.98 MIL/uL (ref 3.87–5.11)
RDW: 12.4 % (ref 11.5–15.5)
WBC: 8 10*3/uL (ref 4.0–10.5)

## 2015-09-12 LAB — COMPREHENSIVE METABOLIC PANEL
ALT: 11 U/L — ABNORMAL LOW (ref 14–54)
AST: 14 U/L — ABNORMAL LOW (ref 15–41)
Albumin: 3.5 g/dL (ref 3.5–5.0)
Alkaline Phosphatase: 38 U/L (ref 38–126)
Anion gap: 7 (ref 5–15)
BUN: <5 mg/dL — ABNORMAL LOW (ref 6–20)
CHLORIDE: 108 mmol/L (ref 101–111)
CO2: 22 mmol/L (ref 22–32)
CREATININE: 0.45 mg/dL (ref 0.44–1.00)
Calcium: 8.7 mg/dL — ABNORMAL LOW (ref 8.9–10.3)
Glucose, Bld: 81 mg/dL (ref 65–99)
Potassium: 3.6 mmol/L (ref 3.5–5.1)
Sodium: 137 mmol/L (ref 135–145)
Total Bilirubin: 0.2 mg/dL — ABNORMAL LOW (ref 0.3–1.2)
Total Protein: 6.7 g/dL (ref 6.5–8.1)

## 2015-09-12 LAB — WET PREP, GENITAL
Sperm: NONE SEEN
Trich, Wet Prep: NONE SEEN
Yeast Wet Prep HPF POC: NONE SEEN

## 2015-09-12 LAB — HCG, QUANTITATIVE, PREGNANCY: HCG, BETA CHAIN, QUANT, S: 14915 m[IU]/mL — AB (ref <5)

## 2015-09-12 LAB — URINALYSIS, ROUTINE W REFLEX MICROSCOPIC
Glucose, UA: NEGATIVE mg/dL
HGB URINE DIPSTICK: NEGATIVE
Leukocytes, UA: NEGATIVE
Nitrite: NEGATIVE
PROTEIN: NEGATIVE mg/dL
Specific Gravity, Urine: 1.034 — ABNORMAL HIGH (ref 1.005–1.030)
pH: 6.5 (ref 5.0–8.0)

## 2015-09-12 LAB — LIPASE, BLOOD: LIPASE: 23 U/L (ref 11–51)

## 2015-09-12 MED ORDER — METRONIDAZOLE 500 MG PO TABS: 500.0000 mg | ORAL_TABLET | Freq: Two times a day (BID) | ORAL | Status: DC

## 2015-09-12 NOTE — ED Notes (Addendum)
Pt [redacted] weeks pregnant c/o sharp pain in lower abdominal pain since this morning. Denies bleeding, injury, N/V. Pt reports taking 1000 mg tylenol at 1030 with no relief.

## 2015-09-12 NOTE — ED Notes (Signed)
PT DISCHARGED. INSTRUCTIONS AND PRESCRIPTION GIVEN. AAOX4. PT IN NO APPARENT DISTRESS. THE OPPORTUNITY TO ASK QUESTIONS WAS PROVIDED. 
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
## 2015-09-12 NOTE — Discharge Instructions (Signed)
Bacterial Vaginosis °Bacterial vaginosis is a vaginal infection that occurs when the normal balance of bacteria in the vagina is disrupted. It results from an overgrowth of certain bacteria. This is the most common vaginal infection in women of childbearing age. Treatment is important to prevent complications, especially in pregnant women, as it can cause a premature delivery. °CAUSES  °Bacterial vaginosis is caused by an increase in harmful bacteria that are normally present in smaller amounts in the vagina. Several different kinds of bacteria can cause bacterial vaginosis. However, the reason that the condition develops is not fully understood. °RISK FACTORS °Certain activities or behaviors can put you at an increased risk of developing bacterial vaginosis, including: °· Having a new sex partner or multiple sex partners. °· Douching. °· Using an intrauterine device (IUD) for contraception. °Women do not get bacterial vaginosis from toilet seats, bedding, swimming pools, or contact with objects around them. °SIGNS AND SYMPTOMS  °Some women with bacterial vaginosis have no signs or symptoms. Common symptoms include: °· Grey vaginal discharge. °· A fishlike odor with discharge, especially after sexual intercourse. °· Itching or burning of the vagina and vulva. °· Burning or pain with urination. °DIAGNOSIS  °Your health care provider will take a medical history and examine the vagina for signs of bacterial vaginosis. A sample of vaginal fluid may be taken. Your health care provider will look at this sample under a microscope to check for bacteria and abnormal cells. A vaginal pH test may also be done.  °TREATMENT  °Bacterial vaginosis may be treated with antibiotic medicines. These may be given in the form of a pill or a vaginal cream. A second round of antibiotics may be prescribed if the condition comes back after treatment. Because bacterial vaginosis increases your risk for sexually transmitted diseases, getting  treated can help reduce your risk for chlamydia, gonorrhea, HIV, and herpes. °HOME CARE INSTRUCTIONS  °· Only take over-the-counter or prescription medicines as directed by your health care provider. °· If antibiotic medicine was prescribed, take it as directed. Make sure you finish it even if you start to feel better. °· Tell all sexual partners that you have a vaginal infection. They should see their health care provider and be treated if they have problems, such as a mild rash or itching. °· During treatment, it is important that you follow these instructions: °· Avoid sexual activity or use condoms correctly. °· Do not douche. °· Avoid alcohol as directed by your health care provider. °· Avoid breastfeeding as directed by your health care provider. °SEEK MEDICAL CARE IF:  °· Your symptoms are not improving after 3 days of treatment. °· You have increased discharge or pain. °· You have a fever. °MAKE SURE YOU:  °· Understand these instructions. °· Will watch your condition. °· Will get help right away if you are not doing well or get worse. °FOR MORE INFORMATION  °Centers for Disease Control and Prevention, Division of STD Prevention: www.cdc.gov/std °American Sexual Health Association (ASHA): www.ashastd.org  °  °This information is not intended to replace advice given to you by your health care provider. Make sure you discuss any questions you have with your health care provider. °  °Document Released: 03/24/2005 Document Revised: 04/14/2014 Document Reviewed: 11/03/2012 °Elsevier Interactive Patient Education ©2016 [employer] °Second Trimester of Pregnancy °The second trimester is from week 13 through week 28, months 4 through 6. The second trimester is often a time when you feel your best. Your body has also adjusted to being   being pregnant, and you begin to feel better physically. Usually, morning sickness has lessened or quit completely, you may have more energy, and you may have an increase in appetite. The  second trimester is also a time when the fetus is growing rapidly. At the end of the sixth month, the fetus is about 9 inches long and weighs about 1 pounds. You will likely begin to feel the baby move (quickening) between 18 and 20 weeks of the pregnancy. BODY CHANGES Your body goes through many changes during pregnancy. The changes vary from woman to woman.   Your weight will continue to increase. You will notice your lower abdomen bulging out.  You may begin to get stretch marks on your hips, abdomen, and breasts.  You may develop headaches that can be relieved by medicines approved by your health care provider.  You may urinate more often because the fetus is pressing on your bladder.  You may develop or continue to have heartburn as a result of your pregnancy.  You may develop constipation because certain hormones are causing the muscles that push waste through your intestines to slow down.  You may develop hemorrhoids or swollen, bulging veins (varicose veins).  You may have back pain because of the weight gain and pregnancy hormones relaxing your joints between the bones in your pelvis and as a result of a shift in weight and the muscles that support your balance.  Your breasts will continue to grow and be tender.  Your gums may bleed and may be sensitive to brushing and flossing.  Dark spots or blotches (chloasma, mask of pregnancy) may develop on your face. This will likely fade after the baby is born.  A dark line from your belly button to the pubic area (linea nigra) may appear. This will likely fade after the baby is born.  You may have changes in your hair. These can include thickening of your hair, rapid growth, and changes in texture. Some women also have hair loss during or after pregnancy, or hair that feels dry or thin. Your hair will most likely return to normal after your baby is born. WHAT TO EXPECT AT YOUR PRENATAL VISITS During a routine prenatal visit:  You  will be weighed to make sure you and the fetus are growing normally.  Your blood pressure will be taken.  Your abdomen will be measured to track your baby's growth.  The fetal heartbeat will be listened to.  Any test results from the previous visit will be discussed. Your health care provider may ask you:  How you are feeling.  If you are feeling the baby move.  If you have had any abnormal symptoms, such as leaking fluid, bleeding, severe headaches, or abdominal cramping.  If you are using any tobacco products, including cigarettes, chewing tobacco, and electronic cigarettes.  If you have any questions. Other tests that may be performed during your second trimester include:  Blood tests that check for:  Low iron levels (anemia).  Gestational diabetes (between 24 and 28 weeks).  Rh antibodies.  Urine tests to check for infections, diabetes, or protein in the urine.  An ultrasound to confirm the proper growth and development of the baby.  An amniocentesis to check for possible genetic problems.  Fetal screens for spina bifida and Down syndrome.  HIV (human immunodeficiency virus) testing. Routine prenatal testing includes screening for HIV, unless you choose not to have this test. HOME CARE INSTRUCTIONS   Avoid all smoking, herbs, alcohol, and  unprescribed drugs. These chemicals affect the formation and growth of the baby.  Do not use any tobacco products, including cigarettes, chewing tobacco, and electronic cigarettes. If you need help quitting, ask your health care provider. You may receive counseling support and other resources to help you quit.  Follow your health care provider's instructions regarding medicine use. There are medicines that are either safe or unsafe to take during pregnancy.  Exercise only as directed by your health care provider. Experiencing uterine cramps is a good sign to stop exercising.  Continue to eat regular, healthy meals.  Wear a good  support bra for breast tenderness.  Do not use hot tubs, steam rooms, or saunas.  Wear your seat belt at all times when driving.  Avoid raw meat, uncooked cheese, cat litter boxes, and soil used by cats. These carry germs that can cause birth defects in the baby.  Take your prenatal vitamins.  Take 1500-2000 mg of calcium daily starting at the 20th week of pregnancy until you deliver your baby.  Try taking a stool softener (if your health care provider approves) if you develop constipation. Eat more high-fiber foods, such as fresh vegetables or fruit and whole grains. Drink plenty of fluids to keep your urine clear or pale yellow.  Take warm sitz baths to soothe any pain or discomfort caused by hemorrhoids. Use hemorrhoid cream if your health care provider approves.  If you develop varicose veins, wear support hose. Elevate your feet for 15 minutes, 3-4 times a day. Limit salt in your diet.  Avoid heavy lifting, wear low heel shoes, and practice good posture.  Rest with your legs elevated if you have leg cramps or low back pain.  Visit your dentist if you have not gone yet during your pregnancy. Use a soft toothbrush to brush your teeth and be gentle when you floss.  A sexual relationship may be continued unless your health care provider directs you otherwise.  Continue to go to all your prenatal visits as directed by your health care provider. SEEK MEDICAL CARE IF:   You have dizziness.  You have mild pelvic cramps, pelvic pressure, or nagging pain in the abdominal area.  You have persistent nausea, vomiting, or diarrhea.  You have a bad smelling vaginal discharge.  You have pain with urination. SEEK IMMEDIATE MEDICAL CARE IF:   You have a fever.  You are leaking fluid from your vagina.  You have spotting or bleeding from your vagina.  You have severe abdominal cramping or pain.  You have rapid weight gain or loss.  You have shortness of breath with chest  pain.  You notice sudden or extreme swelling of your face, hands, ankles, feet, or legs.  You have not felt your baby move in over an hour.  You have severe headaches that do not go away with medicine.  You have vision changes.   This information is not intended to replace advice given to you by your health care provider. Make sure you discuss any questions you have with your health care provider.   Document Released: 03/18/2001 Document Revised: 04/14/2014 Document Reviewed: 05/25/2012 Elsevier Interactive Patient Education [employer]

## 2015-09-13 LAB — GC/CHLAMYDIA PROBE AMP (~~LOC~~) NOT AT ARMC
Chlamydia: NEGATIVE
Neisseria Gonorrhea: NEGATIVE

## 2015-09-14 ENCOUNTER — Ambulatory Visit (HOSPITAL_COMMUNITY): Admission: RE | Admit: 2015-09-14 | Payer: Medicaid Other | Source: Ambulatory Visit

## 2015-11-06 ENCOUNTER — Encounter: Payer: Medicaid Other | Admitting: Family

## 2015-11-08 ENCOUNTER — Encounter (HOSPITAL_COMMUNITY): Payer: Self-pay | Admitting: *Deleted

## 2015-11-08 ENCOUNTER — Inpatient Hospital Stay (HOSPITAL_COMMUNITY)
Admission: AD | Admit: 2015-11-08 | Discharge: 2015-11-08 | Disposition: A | Discharge status: Home / Self Care | Payer: Medicaid Other | Source: Ambulatory Visit | Attending: Obstetrics & Gynecology | Admitting: Obstetrics & Gynecology

## 2015-11-08 DIAGNOSIS — F17210 Nicotine dependence, cigarettes, uncomplicated: Secondary | ICD-10-CM | POA: Insufficient documentation

## 2015-11-08 DIAGNOSIS — F4310 Post-traumatic stress disorder, unspecified: Secondary | ICD-10-CM | POA: Diagnosis not present

## 2015-11-08 DIAGNOSIS — F319 Bipolar disorder, unspecified: Secondary | ICD-10-CM | POA: Diagnosis not present

## 2015-11-08 DIAGNOSIS — O368120 Decreased fetal movements, second trimester, not applicable or unspecified: Secondary | ICD-10-CM

## 2015-11-08 DIAGNOSIS — Z3689 Encounter for other specified antenatal screening: Secondary | ICD-10-CM

## 2015-11-08 DIAGNOSIS — N949 Unspecified condition associated with female genital organs and menstrual cycle: Secondary | ICD-10-CM

## 2015-11-08 DIAGNOSIS — O99342 Other mental disorders complicating pregnancy, second trimester: Secondary | ICD-10-CM | POA: Insufficient documentation

## 2015-11-08 DIAGNOSIS — R102 Pelvic and perineal pain: Secondary | ICD-10-CM | POA: Diagnosis not present

## 2015-11-08 DIAGNOSIS — O99332 Smoking (tobacco) complicating pregnancy, second trimester: Secondary | ICD-10-CM | POA: Diagnosis not present

## 2015-11-08 DIAGNOSIS — Z3A24 24 weeks gestation of pregnancy: Secondary | ICD-10-CM | POA: Insufficient documentation

## 2015-11-08 LAB — URINALYSIS, ROUTINE W REFLEX MICROSCOPIC
BILIRUBIN URINE: NEGATIVE
Glucose, UA: NEGATIVE mg/dL
HGB URINE DIPSTICK: NEGATIVE
KETONES UR: NEGATIVE mg/dL
Leukocytes, UA: NEGATIVE
Nitrite: NEGATIVE
PH: 7 (ref 5.0–8.0)
Protein, ur: NEGATIVE mg/dL
SPECIFIC GRAVITY, URINE: 1.01 (ref 1.005–1.030)
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
## 2015-11-08 NOTE — Discharge Instructions (Signed)

## 2015-11-08 NOTE — MAU Note (Signed)
Pt states she hasn't felt the baby move since last night even after eating and drinking something.  Also C/O rt lower abd cramping pain which started 2-3 days ago.  Denies vaginal bleeding or ROM.

## 2015-11-25 ENCOUNTER — Encounter (HOSPITAL_COMMUNITY): Payer: Self-pay | Admitting: *Deleted

## 2015-11-25 ENCOUNTER — Inpatient Hospital Stay (HOSPITAL_COMMUNITY)
Admission: AD | Admit: 2015-11-25 | Discharge: 2015-11-25 | Disposition: A | Discharge status: Home / Self Care | Payer: BLUE CROSS/BLUE SHIELD | Source: Ambulatory Visit | Attending: Obstetrics and Gynecology | Admitting: Obstetrics and Gynecology

## 2015-11-25 DIAGNOSIS — Z3A26 26 weeks gestation of pregnancy: Secondary | ICD-10-CM | POA: Diagnosis not present

## 2015-11-25 DIAGNOSIS — O99342 Other mental disorders complicating pregnancy, second trimester: Secondary | ICD-10-CM | POA: Insufficient documentation

## 2015-11-25 DIAGNOSIS — F4310 Post-traumatic stress disorder, unspecified: Secondary | ICD-10-CM | POA: Diagnosis not present

## 2015-11-25 DIAGNOSIS — O26892 Other specified pregnancy related conditions, second trimester: Secondary | ICD-10-CM | POA: Insufficient documentation

## 2015-11-25 DIAGNOSIS — O99332 Smoking (tobacco) complicating pregnancy, second trimester: Secondary | ICD-10-CM | POA: Diagnosis not present

## 2015-11-25 DIAGNOSIS — R109 Unspecified abdominal pain: Secondary | ICD-10-CM | POA: Diagnosis present

## 2015-11-25 DIAGNOSIS — O26899 Other specified pregnancy related conditions, unspecified trimester: Secondary | ICD-10-CM

## 2015-11-25 DIAGNOSIS — O99512 Diseases of the respiratory system complicating pregnancy, second trimester: Secondary | ICD-10-CM | POA: Insufficient documentation

## 2015-11-25 LAB — URINE MICROSCOPIC-ADD ON: BACTERIA UA: NONE SEEN

## 2015-11-25 LAB — URINALYSIS, ROUTINE W REFLEX MICROSCOPIC
BILIRUBIN URINE: NEGATIVE
Glucose, UA: NEGATIVE mg/dL
HGB URINE DIPSTICK: NEGATIVE
KETONES UR: NEGATIVE mg/dL
NITRITE: NEGATIVE
PH: 5.5 (ref 5.0–8.0)
Protein, ur: NEGATIVE mg/dL
SPECIFIC GRAVITY, URINE: 1.02 (ref 1.005–1.030)

## 2015-11-25 LAB — WET PREP, GENITAL
Clue Cells Wet Prep HPF POC: NONE SEEN
SPERM: NONE SEEN
Trich, Wet Prep: NONE SEEN
Yeast Wet Prep HPF POC: NONE SEEN

## 2015-11-25 MED ORDER — LACTATED RINGERS IV BOLUS (SEPSIS): 1000.0000 mL | Freq: Once | INTRAVENOUS | Status: DC
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
## 2015-11-25 NOTE — Discharge Instructions (Signed)

## 2015-11-25 NOTE — MAU Note (Signed)
Patient refused IVF, notified Dr. Omer JackMumaw, patient given pitcher of water and instructed to drink explaining that water hydrates the body vs the current soda she is drinking dehydrates.

## 2015-11-25 NOTE — MAU Note (Signed)
Patient presents with no fetal movement today, cramping, and having bloody mucus discharge upon wiping today.

## 2015-11-26 LAB — GC/CHLAMYDIA PROBE AMP (~~LOC~~) NOT AT ARMC
CHLAMYDIA, DNA PROBE: NEGATIVE
Neisseria Gonorrhea: NEGATIVE

## 2016-01-31 LAB — OB RESULTS CONSOLE GBS: GBS: NEGATIVE

## 2016-02-15 ENCOUNTER — Inpatient Hospital Stay (HOSPITAL_COMMUNITY)
Admission: AD | Admit: 2016-02-15 | Discharge: 2016-02-16 | Disposition: A | Discharge status: Home / Self Care | Payer: BLUE CROSS/BLUE SHIELD | Source: Ambulatory Visit | Attending: Obstetrics and Gynecology | Admitting: Obstetrics and Gynecology

## 2016-02-15 ENCOUNTER — Encounter (HOSPITAL_COMMUNITY): Payer: Self-pay

## 2016-02-15 NOTE — MAU Note (Signed)
Was checked yesterday and was 1.5 cm.  Saw bleeding like period fo r2 hours after exam, then went away.  No bleeding today.  No leaking. Contractions got strong at 10 pm tonight. Baby moving well.

## 2016-02-16 ENCOUNTER — Inpatient Hospital Stay (HOSPITAL_COMMUNITY)
Admission: AD | Admit: 2016-02-16 | Discharge: 2016-02-16 | Disposition: A | Discharge status: Home / Self Care | Payer: BLUE CROSS/BLUE SHIELD | Source: Ambulatory Visit | Attending: Obstetrics and Gynecology | Admitting: Obstetrics and Gynecology

## 2016-02-16 ENCOUNTER — Encounter (HOSPITAL_COMMUNITY): Payer: Self-pay | Admitting: Certified Nurse Midwife

## 2016-02-16 ENCOUNTER — Encounter (HOSPITAL_COMMUNITY): Payer: Self-pay | Admitting: *Deleted

## 2016-02-16 MED ORDER — DIPHENHYDRAMINE HCL 25 MG PO CAPS: 50.0000 mg | ORAL_CAPSULE | Freq: Once | ORAL | Status: DC

## 2016-02-16 MED ORDER — ZOLPIDEM TARTRATE 5 MG PO TABS
5.0000 mg | ORAL_TABLET | Freq: Once | ORAL | Status: AC
  Administered 2016-02-16: 5 mg via ORAL
  Filled 2016-02-16: qty 1
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
## 2016-02-16 NOTE — Discharge Instructions (Signed)
Braxton Hicks Contractions °Contractions of the uterus can occur throughout pregnancy. Contractions are not always a sign that you are in labor.  °WHAT ARE BRAXTON HICKS CONTRACTIONS?  °Contractions that occur before labor are called Braxton Hicks contractions, or false labor. Toward the end of pregnancy (32-34 weeks), these contractions can develop more often and may become more forceful. This is not true labor because these contractions do not result in opening (dilatation) and thinning of the cervix. They are sometimes difficult to tell apart from true labor because these contractions can be forceful and people have different pain tolerances. You should not feel embarrassed if you go to the hospital with false labor. Sometimes, the only way to tell if you are in true labor is for your health care provider to look for changes in the cervix. °If there are no prenatal problems or other health problems associated with the pregnancy, it is completely safe to be sent home with false labor and await the onset of true labor. °HOW CAN YOU TELL THE DIFFERENCE BETWEEN TRUE AND FALSE LABOR? °False Labor °· The contractions of false labor are usually shorter and not as hard as those of true labor.   °· The contractions are usually irregular.   °· The contractions are often felt in the front of the lower abdomen and in the groin.   °· The contractions may go away when you walk around or change positions while lying down.   °· The contractions get weaker and are shorter lasting as time goes on.   °· The contractions do not usually become progressively stronger, regular, and closer together as with true labor.   °True Labor °· Contractions in true labor last 30-70 seconds, become very regular, usually become more intense, and increase in frequency.   °· The contractions do not go away with walking.   °· The discomfort is usually felt in the top of the uterus and spreads to the lower abdomen and low back.   °· True labor can be  determined by your health care provider with an exam. This will show that the cervix is dilating and getting thinner.   °WHAT TO REMEMBER °· Keep up with your usual exercises and follow other instructions given by your health care provider.   °· Take medicines as directed by your health care provider.   °· Keep your regular prenatal appointments.   °· Eat and drink lightly if you think you are going into labor.   °· If Braxton Hicks contractions are making you uncomfortable:   °¨ Change your position from lying down or resting to walking, or from walking to resting.   °¨ Sit and rest in a tub of warm water.   °¨ Drink 2-3 glasses of water. Dehydration may cause these contractions.   °¨ Do slow and deep breathing several times an hour.   °WHEN SHOULD I SEEK IMMEDIATE MEDICAL CARE? °Seek immediate medical care if: °· Your contractions become stronger, more regular, and closer together.   °· You have fluid leaking or gushing from your vagina.   °· You have a fever.   °· You pass blood-tinged mucus.   °· You have vaginal bleeding.   °· You have continuous abdominal pain.   °· You have low back pain that you never had before.   °· You feel your baby's head pushing down and causing pelvic pressure.   °· Your baby is not moving as much as it used to.   °  °This information is not intended to replace advice given to you by your health care provider. Make sure you discuss any questions you have with your health care   provider.   Document Released: 03/24/2005 Document Revised: 03/29/2013 Document Reviewed: 01/03/2013 Elsevier Interactive Patient Education [employer] Third Trimester of Pregnancy The third trimester is from week 29 through week 42, months 7 through 9. This trimester is when your unborn baby (fetus) is growing very fast. At the end of the ninth month, the unborn baby is about 20 inches in length. It weighs about 6-10 pounds.  HOME CARE   Avoid all smoking, herbs, and alcohol. Avoid drugs not  approved by your doctor.  Do not use any tobacco products, including cigarettes, chewing tobacco, and electronic cigarettes. If you need help quitting, ask your doctor. You may get counseling or other support to help you quit.  Only take medicine as told by your doctor. Some medicines are safe and some are not during pregnancy.  Exercise only as told by your doctor. Stop exercising if you start having cramps.  Eat regular, healthy meals.  Wear a good support bra if your breasts are tender.  Do not use hot tubs, steam rooms, or saunas.  Wear your seat belt when driving.  Avoid raw meat, uncooked cheese, and liter boxes and soil used by cats.  Take your prenatal vitamins.  Take 1500-2000 milligrams of calcium daily starting at the 20th week of pregnancy until you deliver your baby.  Try taking medicine that helps you poop (stool softener) as needed, and if your doctor approves. Eat more fiber by eating fresh fruit, vegetables, and whole grains. Drink enough fluids to keep your pee (urine) clear or pale yellow.  Take warm water baths (sitz baths) to soothe pain or discomfort caused by hemorrhoids. Use hemorrhoid cream if your doctor approves.  If you have puffy, bulging veins (varicose veins), wear support hose. Raise (elevate) your feet for 15 minutes, 3-4 times a day. Limit salt in your diet.  Avoid heavy lifting, wear low heels, and sit up straight.  Rest with your legs raised if you have leg cramps or low back pain.  Visit your dentist if you have not gone during your pregnancy. Use a soft toothbrush to brush your teeth. Be gentle when you floss.  You can have sex (intercourse) unless your doctor tells you not to.  Do not travel far distances unless you must. Only do so with your doctor's approval.  Take prenatal classes.  Practice driving to the hospital.  Pack your hospital bag.  Prepare the baby's room.  Go to your doctor visits. GET HELP IF:  You are not sure if  you are in labor or if your water has broken.  You are dizzy.  You have mild cramps or pressure in your lower belly (abdominal).  You have a nagging pain in your belly area.  You continue to feel sick to your stomach (nauseous), throw up (vomit), or have watery poop (diarrhea).  You have bad smelling fluid coming from your vagina.  You have pain with peeing (urination). GET HELP RIGHT AWAY IF:   You have a fever.  You are leaking fluid from your vagina.  You are spotting or bleeding from your vagina.  You have severe belly cramping or pain.  You lose or gain weight rapidly.  You have trouble catching your breath and have chest pain.  You notice sudden or extreme puffiness (swelling) of your face, hands, ankles, feet, or legs.  You have not felt the baby move in over an hour.  You have severe headaches that do not go away with medicine.  You  have vision changes.   This information is not intended to replace advice given to you by your health care provider. Make sure you discuss any questions you have with your health care provider.   Document Released: 06/18/2009 Document Revised: 04/14/2014 Document Reviewed: 05/25/2012 Elsevier Interactive Patient Education 2016 Else[employer] Inc. Fetal Movement Counts Patient Name: __________________________________________________ Patient Due Date: ____________________ Performing a fetal movement count is highly recommended in high-risk pregnancies, but it is good for every pregnant woman to do. Your health care provider may ask you to start counting fetal movements at 28 weeks of the pregnancy. Fetal movements often increase:  After eating a full meal.  After physical activity.  After eating or drinking something sweet or cold.  At rest. Pay attention to when you feel the baby is most active. This will help you notice a pattern of your baby's sleep and wake cycles and what factors contribute to an increase in fetal movement. It is  important to perform a fetal movement count at the same time each day when your baby is normally most active.  HOW TO COUNT FETAL MOVEMENTS 1. Find a quiet and comfortable area to sit or lie down on your left side. Lying on your left side provides the best blood and oxygen circulation to your baby. 2. Write down the day and time on a sheet of paper or in a journal. 3. Start counting kicks, flutters, swishes, rolls, or jabs in a 2-hour period. You should feel at least 10 movements within 2 hours. 4. If you do not feel 10 movements in 2 hours, wait 2-3 hours and count again. Look for a change in the pattern or not enough counts in 2 hours. SEEK MEDICAL CARE IF:  You feel less than 10 counts in 2 hours, tried twice.  There is no movement in over an hour.  The pattern is changing or taking longer each day to reach 10 counts in 2 hours.  You feel the baby is not moving as he or she usually does. Date: ____________ Movements: ____________ Start time: ____________ Doreatha MartinFinish time: ____________  Date: ____________ Movements: ____________ Start time: ____________ Doreatha MartinFinish time: ____________ Date: ____________ Movements: ____________ Start time: ____________ Doreatha MartinFinish time: ____________ Date: ____________ Movements: ____________ Start time: ____________ Doreatha MartinFinish time: ____________ Date: ____________ Movements: ____________ Start time: ____________ Doreatha MartinFinish time: ____________ Date: ____________ Movements: ____________ Start time: ____________ Doreatha MartinFinish time: ____________ Date: ____________ Movements: ____________ Start time: ____________ Doreatha MartinFinish time: ____________ Date: ____________ Movements: ____________ Start time: ____________ Doreatha MartinFinish time: ____________  Date: ____________ Movements: ____________ Start time: ____________ Doreatha MartinFinish time: ____________ Date: ____________ Movements: ____________ Start time: ____________ Doreatha MartinFinish time: ____________ Date: ____________ Movements: ____________ Start time: ____________ Doreatha MartinFinish  time: ____________ Date: ____________ Movements: ____________ Start time: ____________ Doreatha MartinFinish time: ____________ Date: ____________ Movements: ____________ Start time: ____________ Doreatha MartinFinish time: ____________ Date: ____________ Movements: ____________ Start time: ____________ Doreatha MartinFinish time: ____________ Date: ____________ Movements: ____________ Start time: ____________ Doreatha MartinFinish time: ____________  Date: ____________ Movements: ____________ Start time: ____________ Doreatha MartinFinish time: ____________ Date: ____________ Movements: ____________ Start time: ____________ Doreatha MartinFinish time: ____________ Date: ____________ Movements: ____________ Start time: ____________ Doreatha MartinFinish time: ____________ Date: ____________ Movements: ____________ Start time: ____________ Doreatha MartinFinish time: ____________ Date: ____________ Movements: ____________ Start time: ____________ Doreatha MartinFinish time: ____________ Date: ____________ Movements: ____________ Start time: ____________ Doreatha MartinFinish time: ____________ Date: ____________ Movements: ____________ Start time: ____________ Doreatha MartinFinish time: ____________  Date: ____________ Movements: ____________ Start time: ____________ Doreatha MartinFinish time: ____________ Date: ____________ Movements: ____________ Start time: ____________ Doreatha MartinFinish time: ____________ Date: ____________ Movements: ____________ Start time: ____________  Finish time: ____________ Date: ____________ Movements: ____________ Start time: ____________ Elizebeth Koller time: ____________ Date: ____________ Movements: ____________ Start time: ____________ Elizebeth Koller time: ____________ Date: ____________ Movements: ____________ Start time: ____________ Elizebeth Koller time: ____________ Date: ____________ Movements: ____________ Start time: ____________ Elizebeth Koller time: ____________  Date: ____________ Movements: ____________ Start time: ____________ Elizebeth Koller time: ____________ Date: ____________ Movements: ____________ Start time: ____________ Elizebeth Koller time: ____________ Date: ____________  Movements: ____________ Start time: ____________ Elizebeth Koller time: ____________ Date: ____________ Movements: ____________ Start time: ____________ Elizebeth Koller time: ____________ Date: ____________ Movements: ____________ Start time: ____________ Elizebeth Koller time: ____________ Date: ____________ Movements: ____________ Start time: ____________ Elizebeth Koller time: ____________ Date: ____________ Movements: ____________ Start time: ____________ Elizebeth Koller time: ____________  Date: ____________ Movements: ____________ Start time: ____________ Elizebeth Koller time: ____________ Date: ____________ Movements: ____________ Start time: ____________ Elizebeth Koller time: ____________ Date: ____________ Movements: ____________ Start time: ____________ Elizebeth Koller time: ____________ Date: ____________ Movements: ____________ Start time: ____________ Elizebeth Koller time: ____________ Date: ____________ Movements: ____________ Start time: ____________ Elizebeth Koller time: ____________ Date: ____________ Movements: ____________ Start time: ____________ Elizebeth Koller time: ____________ Date: ____________ Movements: ____________ Start time: ____________ Elizebeth Koller time: ____________  Date: ____________ Movements: ____________ Start time: ____________ Elizebeth Koller time: ____________ Date: ____________ Movements: ____________ Start time: ____________ Elizebeth Koller time: ____________ Date: ____________ Movements: ____________ Start time: ____________ Elizebeth Koller time: ____________ Date: ____________ Movements: ____________ Start time: ____________ Elizebeth Koller time: ____________ Date: ____________ Movements: ____________ Start time: ____________ Elizebeth Koller time: ____________ Date: ____________ Movements: ____________ Start time: ____________ Elizebeth Koller time: ____________ Date: ____________ Movements: ____________ Start time: ____________ Elizebeth Koller time: ____________  Date: ____________ Movements: ____________ Start time: ____________ Elizebeth Koller time: ____________ Date: ____________ Movements: ____________ Start time:  ____________ Elizebeth Koller time: ____________ Date: ____________ Movements: ____________ Start time: ____________ Elizebeth Koller time: ____________ Date: ____________ Movements: ____________ Start time: ____________ Elizebeth Koller time: ____________ Date: ____________ Movements: ____________ Start time: ____________ Elizebeth Koller time: ____________ Date: ____________ Movements: ____________ Start time: ____________ Elizebeth Koller time: ____________   This information is not intended to replace advice given to you by your health care provider. Make sure you discuss any questions you have with your health care provider.   Document Released: 04/23/2006 Document Revised: 04/14/2014 Document Reviewed: 01/19/2012 Elsevier Interactive Patient Education [employer].

## 2016-02-16 NOTE — Progress Notes (Signed)
Telephone call to [name], CNM.  Dicussed pt contraction pattern, fetal heart tracing , pt may have Ambien 5 mg PO and d/c home

## 2016-02-16 NOTE — MAU Note (Signed)
3rd trip to MAU in past 24 hrs.  Contractions have not improved

## 2016-02-16 NOTE — MAU Note (Signed)
Pt states her ctxs are worse this morning. Pt denies LOF or vaginal bleeding. Fetus is active.

## 2016-02-16 NOTE — Discharge Instructions (Signed)
Fetal Movement Counts °Patient Name: __________________________________________________ Patient Due Date: ____________________ °Performing a fetal movement count is highly recommended in high-risk pregnancies, but it is good for every pregnant woman to do. Your health care provider may ask you to start counting fetal movements at 28 weeks of the pregnancy. Fetal movements often increase: °· After eating a full meal. °· After physical activity. °· After eating or drinking something sweet or cold. °· At rest. °Pay attention to when you feel the baby is most active. This will help you notice a pattern of your baby's sleep and wake cycles and what factors contribute to an increase in fetal movement. It is important to perform a fetal movement count at the same time each day when your baby is normally most active.  °HOW TO COUNT FETAL MOVEMENTS °1. Find a quiet and comfortable area to sit or lie down on your left side. Lying on your left side provides the best blood and oxygen circulation to your baby. °2. Write down the day and time on a sheet of paper or in a journal. °3. Start counting kicks, flutters, swishes, rolls, or jabs in a 2-hour period. You should feel at least 10 movements within 2 hours. °4. If you do not feel 10 movements in 2 hours, wait 2-3 hours and count again. Look for a change in the pattern or not enough counts in 2 hours. °SEEK MEDICAL CARE IF: °· You feel less than 10 counts in 2 hours, tried twice. °· There is no movement in over an hour. °· The pattern is changing or taking longer each day to reach 10 counts in 2 hours. °· You feel the baby is not moving as he or she usually does. °Date: ____________ Movements: ____________ Start time: ____________ Finish time: ____________  °Date: ____________ Movements: ____________ Start time: ____________ Finish time: ____________ °Date: ____________ Movements: ____________ Start time: ____________ Finish time: ____________ °Date: ____________ Movements:  ____________ Start time: ____________ Finish time: ____________ °Date: ____________ Movements: ____________ Start time: ____________ Finish time: ____________ °Date: ____________ Movements: ____________ Start time: ____________ Finish time: ____________ °Date: ____________ Movements: ____________ Start time: ____________ Finish time: ____________ °Date: ____________ Movements: ____________ Start time: ____________ Finish time: ____________  °Date: ____________ Movements: ____________ Start time: ____________ Finish time: ____________ °Date: ____________ Movements: ____________ Start time: ____________ Finish time: ____________ °Date: ____________ Movements: ____________ Start time: ____________ Finish time: ____________ °Date: ____________ Movements: ____________ Start time: ____________ Finish time: ____________ °Date: ____________ Movements: ____________ Start time: ____________ Finish time: ____________ °Date: ____________ Movements: ____________ Start time: ____________ Finish time: ____________ °Date: ____________ Movements: ____________ Start time: ____________ Finish time: ____________  °Date: ____________ Movements: ____________ Start time: ____________ Finish time: ____________ °Date: ____________ Movements: ____________ Start time: ____________ Finish time: ____________ °Date: ____________ Movements: ____________ Start time: ____________ Finish time: ____________ °Date: ____________ Movements: ____________ Start time: ____________ Finish time: ____________ °Date: ____________ Movements: ____________ Start time: ____________ Finish time: ____________ °Date: ____________ Movements: ____________ Start time: ____________ Finish time: ____________ °Date: ____________ Movements: ____________ Start time: ____________ Finish time: ____________  °Date: ____________ Movements: ____________ Start time: ____________ Finish time: ____________ °Date: ____________ Movements: ____________ Start time: ____________ Finish  time: ____________ °Date: ____________ Movements: ____________ Start time: ____________ Finish time: ____________ °Date: ____________ Movements: ____________ Start time: ____________ Finish time: ____________ °Date: ____________ Movements: ____________ Start time: ____________ Finish time: ____________ °Date: ____________ Movements: ____________ Start time: ____________ Finish time: ____________ °Date: ____________ Movements: ____________ Start time: ____________ Finish time: ____________  °Date: ____________ Movements: ____________ Start time: ____________ Finish   time: ____________ °Date: ____________ Movements: ____________ Start time: ____________ Finish time: ____________ °Date: ____________ Movements: ____________ Start time: ____________ Finish time: ____________ °Date: ____________ Movements: ____________ Start time: ____________ Finish time: ____________ °Date: ____________ Movements: ____________ Start time: ____________ Finish time: ____________ °Date: ____________ Movements: ____________ Start time: ____________ Finish time: ____________ °Date: ____________ Movements: ____________ Start time: ____________ Finish time: ____________  °Date: ____________ Movements: ____________ Start time: ____________ Finish time: ____________ °Date: ____________ Movements: ____________ Start time: ____________ Finish time: ____________ °Date: ____________ Movements: ____________ Start time: ____________ Finish time: ____________ °Date: ____________ Movements: ____________ Start time: ____________ Finish time: ____________ °Date: ____________ Movements: ____________ Start time: ____________ Finish time: ____________ °Date: ____________ Movements: ____________ Start time: ____________ Finish time: ____________ °Date: ____________ Movements: ____________ Start time: ____________ Finish time: ____________  °Date: ____________ Movements: ____________ Start time: ____________ Finish time: ____________ °Date: ____________  Movements: ____________ Start time: ____________ Finish time: ____________ °Date: ____________ Movements: ____________ Start time: ____________ Finish time: ____________ °Date: ____________ Movements: ____________ Start time: ____________ Finish time: ____________ °Date: ____________ Movements: ____________ Start time: ____________ Finish time: ____________ °Date: ____________ Movements: ____________ Start time: ____________ Finish time: ____________ °Date: ____________ Movements: ____________ Start time: ____________ Finish time: ____________  °Date: ____________ Movements: ____________ Start time: ____________ Finish time: ____________ °Date: ____________ Movements: ____________ Start time: ____________ Finish time: ____________ °Date: ____________ Movements: ____________ Start time: ____________ Finish time: ____________ °Date: ____________ Movements: ____________ Start time: ____________ Finish time: ____________ °Date: ____________ Movements: ____________ Start time: ____________ Finish time: ____________ °Date: ____________ Movements: ____________ Start time: ____________ Finish time: ____________ °  °This information is not intended to replace advice given to you by your health care provider. Make sure you discuss any questions you have with your health care provider. °  °Document Released: 04/23/2006 Document Revised: 04/14/2014 Document Reviewed: 01/19/2012 °Elsevier Interactive Patient Education ©2016 [employer] °Vaginal Delivery °During delivery, your health care provider will help you give birth to your baby. During a vaginal delivery, you will work to push the baby out of your vagina. However, before you can push your baby out, a few things need to happen. The opening of your uterus (cervix) has to soften, thin out, and open up (dilate) all the way to 10 cm. Also, your baby has to move down from the uterus into your vagina.  °SIGNS OF LABOR  °Your health care provider will first need to make sure you  are in labor. Signs of labor include:  °· Passing what is called the mucous plug before labor begins. This is a small amount of blood-stained mucus. °· Having regular, painful uterine contractions.   °· The time between contractions gets shorter.   °· The discomfort and pain gradually get more intense. °· Contraction pains get worse when walking and do not go away when resting.   °· Your cervix becomes thinner (effacement) and dilates. °BEFORE THE DELIVERY °Once you are in labor and admitted into the hospital or care center, your health care provider may do the following:  °· Perform a complete physical exam. °· Review any complications related to pregnancy or labor.  °· Check your blood pressure, pulse, temperature, and heart rate (vital signs).   °· Determine if, and when, the rupture of amniotic membranes occurred. °· Do a vaginal exam (using a sterile glove and lubricant) to determine:   °¨ The position (presentation) of the baby. Is the baby's head presenting first (vertex) in the birth canal (vagina), or are the feet or buttocks first (breech)?   °¨ The level (station) of the baby's head within   the birth canal.   °¨ The effacement and dilatation of the cervix.   °· An electronic fetal monitor is usually placed on your abdomen when you first arrive. This is used to monitor your contractions and the baby's heart rate. °¨ When the monitor is on your abdomen (external fetal monitor), it can only pick up the frequency and length of your contractions. It cannot tell the strength of your contractions. °¨ If it becomes necessary for your health care provider to know exactly how strong your contractions are or to see exactly what the baby's heart rate is doing, an internal monitor may be inserted into your vagina and uterus. Your health care provider will discuss the benefits and risks of using an internal monitor and obtain your permission before inserting the device. °¨ Continuous fetal monitoring may be needed if  you have an epidural, are receiving certain medicines (such as oxytocin), or have pregnancy or labor complications. °· An IV access tube may be placed into a vein in your arm to deliver fluids and medicines if necessary. °THREE STAGES OF LABOR AND DELIVERY °Normal labor and delivery is divided into three stages. °First Stage °This stage starts when you begin to contract regularly and your cervix begins to efface and dilate. It ends when your cervix is completely open (fully dilated). The first stage is the longest stage of labor and can last from 3 hours to 15 hours.  °Several methods are available to help with labor pain. You and your health care provider will decide which option is best for you. Options include:  °· Opioid medicines. These are strong pain medicines that you can get through your IV tube or as a shot into your muscle. These medicines lessen pain but do not make it go away completely.  °· Epidural. A medicine is given through a thin tube that is inserted in your back. The medicine numbs the lower part of your body and prevents any pain in that area. °· Paracervical pain medicine. This is an injection of an anesthetic on each side of your cervix.   °· You may request natural childbirth, which does not involve the use of pain medicines or an epidural during labor and delivery. Instead, you will use other things, such as breathing exercises, to help cope with the pain. °Second Stage °The second stage of labor begins when your cervix is fully dilated at 10 cm. It continues until you push your baby down through the birth canal and the baby is born. This stage can take only minutes or several hours. °· The location of your baby's head as it moves through the birth canal is reported as a number called a station. If the baby's head has not started its descent, the station is described as being at minus 3 (-3). When your baby's head is at the zero station, it is at the middle of the birth canal and is engaged  in the pelvis. The station of your baby helps indicate the progress of the second stage of labor. °· When your baby is born, your health care provider may hold the baby with his or her head lowered to prevent amniotic fluid, mucus, and blood from getting into the baby's lungs. The baby's mouth and nose may be suctioned with a small bulb syringe to remove any additional fluid. °· Your health care provider may then place the baby on your stomach. It is important to keep the baby from getting cold. To do this, the health care provider will dry   the baby off, place the baby directly on your skin (with no blankets between you and the baby), and cover the baby with warm, dry blankets.   °· The umbilical cord is cut. °Third Stage °During the third stage of labor, your health care provider will deliver the placenta (afterbirth) and make sure your bleeding is under control. The delivery of the placenta usually takes about 5 minutes but can take up to 30 minutes. After the placenta is delivered, a medicine may be given either by IV or injection to help contract the uterus and control bleeding. If you are planning to breastfeed, you can try to do so now. °After you deliver the placenta, your uterus should contract and get very firm. If your uterus does not remain firm, your health care provider will massage it. This is important because the contraction of the uterus helps cut off bleeding at the site where the placenta was attached to your uterus. If your uterus does not contract properly and stay firm, you may continue to bleed heavily. If there is a lot of bleeding, medicines may be given to contract the uterus and stop the bleeding.  °  °This information is not intended to replace advice given to you by your health care provider. Make sure you discuss any questions you have with your health care provider. °  °Document Released: 01/01/2008 Document Revised: 04/14/2014 Document Reviewed: 11/19/2011 °Elsevier Interactive  Patient Education ©2016 [employer] ° °

## 2016-02-17 ENCOUNTER — Inpatient Hospital Stay (HOSPITAL_COMMUNITY): Payer: BLUE CROSS/BLUE SHIELD | Admitting: Anesthesiology

## 2016-02-17 ENCOUNTER — Inpatient Hospital Stay (HOSPITAL_COMMUNITY)
Admission: AD | Admit: 2016-02-17 | Discharge: 2016-02-18 | DRG: 767 | Disposition: A | Discharge status: Home / Self Care | Payer: BLUE CROSS/BLUE SHIELD | Source: Ambulatory Visit | Attending: Obstetrics and Gynecology | Admitting: Obstetrics and Gynecology

## 2016-02-17 ENCOUNTER — Encounter (HOSPITAL_COMMUNITY): Payer: Self-pay

## 2016-02-17 DIAGNOSIS — O99344 Other mental disorders complicating childbirth: Secondary | ICD-10-CM | POA: Diagnosis present

## 2016-02-17 DIAGNOSIS — Z8249 Family history of ischemic heart disease and other diseases of the circulatory system: Secondary | ICD-10-CM | POA: Diagnosis not present

## 2016-02-17 DIAGNOSIS — F319 Bipolar disorder, unspecified: Secondary | ICD-10-CM | POA: Diagnosis present

## 2016-02-17 DIAGNOSIS — O9952 Diseases of the respiratory system complicating childbirth: Secondary | ICD-10-CM | POA: Diagnosis present

## 2016-02-17 DIAGNOSIS — F17210 Nicotine dependence, cigarettes, uncomplicated: Secondary | ICD-10-CM | POA: Diagnosis present

## 2016-02-17 DIAGNOSIS — Z302 Encounter for sterilization: Secondary | ICD-10-CM

## 2016-02-17 DIAGNOSIS — J45909 Unspecified asthma, uncomplicated: Secondary | ICD-10-CM | POA: Diagnosis present

## 2016-02-17 DIAGNOSIS — T6391XA Toxic effect of contact with unspecified venomous animal, accidental (unintentional), initial encounter: Secondary | ICD-10-CM

## 2016-02-17 DIAGNOSIS — Z3A38 38 weeks gestation of pregnancy: Secondary | ICD-10-CM

## 2016-02-17 DIAGNOSIS — O99334 Smoking (tobacco) complicating childbirth: Secondary | ICD-10-CM | POA: Diagnosis present

## 2016-02-17 DIAGNOSIS — Z3403 Encounter for supervision of normal first pregnancy, third trimester: Secondary | ICD-10-CM | POA: Diagnosis present

## 2016-02-17 DIAGNOSIS — Z91013 Allergy to seafood: Secondary | ICD-10-CM

## 2016-02-17 LAB — CBC
HCT: 34.9 % — ABNORMAL LOW (ref 36.0–46.0)
HEMOGLOBIN: 12 g/dL (ref 12.0–15.0)
MCH: 28.8 pg (ref 26.0–34.0)
MCHC: 34.4 g/dL (ref 30.0–36.0)
MCV: 83.9 fL (ref 78.0–100.0)
Platelets: 285 10*3/uL (ref 150–400)
RBC: 4.16 MIL/uL (ref 3.87–5.11)
RDW: 13.7 % (ref 11.5–15.5)
WBC: 21.7 10*3/uL — AB (ref 4.0–10.5)

## 2016-02-17 LAB — TYPE AND SCREEN
ABO/RH(D): A POS
Antibody Screen: NEGATIVE

## 2016-02-17 LAB — RPR: RPR: NONREACTIVE

## 2016-02-17 MED ORDER — LIDOCAINE HCL (PF) 1 % IJ SOLN
INTRAMUSCULAR | Status: DC | PRN
  Administered 2016-02-17 (×2): 7 mL via EPIDURAL

## 2016-02-17 MED ORDER — PHENYLEPHRINE 40 MCG/ML (10ML) SYRINGE FOR IV PUSH (FOR BLOOD PRESSURE SUPPORT)
80.0000 ug | PREFILLED_SYRINGE | INTRAVENOUS | Status: DC | PRN
  Administered 2016-02-17: 80 ug via INTRAVENOUS
  Filled 2016-02-17: qty 5

## 2016-02-17 MED ORDER — FENTANYL 2.5 MCG/ML BUPIVACAINE 1/10 % EPIDURAL INFUSION (WH - ANES)
14.0000 mL/h | INTRAMUSCULAR | Status: DC | PRN
  Administered 2016-02-17: 14 mL/h via EPIDURAL

## 2016-02-17 MED ORDER — PHENYLEPHRINE 40 MCG/ML (10ML) SYRINGE FOR IV PUSH (FOR BLOOD PRESSURE SUPPORT)
PREFILLED_SYRINGE | INTRAVENOUS | Status: AC
  Filled 2016-02-17: qty 20

## 2016-02-17 MED ORDER — FAMOTIDINE 20 MG PO TABS: 40.0000 mg | ORAL_TABLET | Freq: Once | ORAL | Status: DC

## 2016-02-17 MED ORDER — LACTATED RINGERS IV SOLN
INTRAVENOUS | Status: DC
  Administered 2016-02-17: 07:00:00 via INTRAUTERINE

## 2016-02-17 MED ORDER — TERBUTALINE SULFATE 1 MG/ML IJ SOLN
0.2500 mg | Freq: Once | INTRAMUSCULAR | Status: DC | PRN
  Filled 2016-02-17: qty 1

## 2016-02-17 MED ORDER — DIPHENHYDRAMINE HCL 25 MG PO CAPS
25.0000 mg | ORAL_CAPSULE | Freq: Four times a day (QID) | ORAL | Status: DC | PRN
Start: 2016-02-17 — End: 2016-02-18

## 2016-02-17 MED ORDER — OXYCODONE-ACETAMINOPHEN 5-325 MG PO TABS: 2.0000 | ORAL_TABLET | ORAL | Status: DC | PRN

## 2016-02-17 MED ORDER — LIDOCAINE HCL (PF) 1 % IJ SOLN
30.0000 mL | INTRAMUSCULAR | Status: DC | PRN
  Filled 2016-02-17: qty 30

## 2016-02-17 MED ORDER — OXYTOCIN BOLUS FROM INFUSION
500.0000 mL | Freq: Once | INTRAVENOUS | Status: AC
  Administered 2016-02-17: 500 mL via INTRAVENOUS

## 2016-02-17 MED ORDER — OXYTOCIN 40 UNITS IN LACTATED RINGERS INFUSION - SIMPLE MED
2.5000 [IU]/h | INTRAVENOUS | Status: DC
  Filled 2016-02-17: qty 1000

## 2016-02-17 MED ORDER — PHENYLEPHRINE 40 MCG/ML (10ML) SYRINGE FOR IV PUSH (FOR BLOOD PRESSURE SUPPORT)
80.0000 ug | PREFILLED_SYRINGE | INTRAVENOUS | Status: DC | PRN
  Filled 2016-02-17: qty 5

## 2016-02-17 MED ORDER — OXYCODONE-ACETAMINOPHEN 5-325 MG PO TABS: 1.0000 | ORAL_TABLET | ORAL | Status: DC | PRN

## 2016-02-17 MED ORDER — BENZOCAINE-MENTHOL 20-0.5 % EX AERO: 1.0000 "application " | INHALATION_SPRAY | CUTANEOUS | Status: DC | PRN

## 2016-02-17 MED ORDER — SIMETHICONE 80 MG PO CHEW: 80.0000 mg | CHEWABLE_TABLET | ORAL | Status: DC | PRN

## 2016-02-17 MED ORDER — IBUPROFEN 600 MG PO TABS
600.0000 mg | ORAL_TABLET | Freq: Four times a day (QID) | ORAL | Status: DC
  Administered 2016-02-17 – 2016-02-18 (×4): 600 mg via ORAL
  Filled 2016-02-17 (×4): qty 1

## 2016-02-17 MED ORDER — LACTATED RINGERS IV SOLN: INTRAVENOUS | Status: DC

## 2016-02-17 MED ORDER — SENNOSIDES-DOCUSATE SODIUM 8.6-50 MG PO TABS
2.0000 | ORAL_TABLET | ORAL | Status: DC
  Administered 2016-02-17: 2 via ORAL
  Filled 2016-02-17: qty 2

## 2016-02-17 MED ORDER — FENTANYL 2.5 MCG/ML BUPIVACAINE 1/10 % EPIDURAL INFUSION (WH - ANES)
INTRAMUSCULAR | Status: AC
  Filled 2016-02-17: qty 100

## 2016-02-17 MED ORDER — TETANUS-DIPHTH-ACELL PERTUSSIS 5-2.5-18.5 LF-MCG/0.5 IM SUSP: 0.5000 mL | Freq: Once | INTRAMUSCULAR | Status: DC

## 2016-02-17 MED ORDER — METOCLOPRAMIDE HCL 10 MG PO TABS: 10.0000 mg | ORAL_TABLET | Freq: Once | ORAL | Status: DC

## 2016-02-17 MED ORDER — OXYTOCIN 40 UNITS IN LACTATED RINGERS INFUSION - SIMPLE MED
1.0000 m[IU]/min | INTRAVENOUS | Status: DC
  Administered 2016-02-17: 1 m[IU]/min via INTRAVENOUS

## 2016-02-17 MED ORDER — ACETAMINOPHEN 325 MG PO TABS: 650.0000 mg | ORAL_TABLET | ORAL | Status: DC | PRN

## 2016-02-17 MED ORDER — LACTATED RINGERS IV SOLN
500.0000 mL | Freq: Once | INTRAVENOUS | Status: AC
  Administered 2016-02-17: 500 mL via INTRAVENOUS

## 2016-02-17 MED ORDER — WITCH HAZEL-GLYCERIN EX PADS: 1.0000 "application " | MEDICATED_PAD | CUTANEOUS | Status: DC | PRN

## 2016-02-17 MED ORDER — ONDANSETRON HCL 4 MG/2ML IJ SOLN: 4.0000 mg | Freq: Four times a day (QID) | INTRAMUSCULAR | Status: DC | PRN

## 2016-02-17 MED ORDER — LACTATED RINGERS IV SOLN
500.0000 mL | INTRAVENOUS | Status: DC | PRN
  Administered 2016-02-17: 500 mL via INTRAVENOUS
  Administered 2016-02-17: 1000 mL via INTRAVENOUS

## 2016-02-17 MED ORDER — EPHEDRINE 5 MG/ML INJ
10.0000 mg | INTRAVENOUS | Status: DC | PRN
  Filled 2016-02-17: qty 4

## 2016-02-17 MED ORDER — OXYTOCIN 40 UNITS IN LACTATED RINGERS INFUSION - SIMPLE MED: 1.0000 m[IU]/min | INTRAVENOUS | Status: DC

## 2016-02-17 MED ORDER — SOD CITRATE-CITRIC ACID 500-334 MG/5ML PO SOLN: 30.0000 mL | ORAL | Status: DC | PRN

## 2016-02-17 MED ORDER — DIBUCAINE 1 % RE OINT: 1.0000 "application " | TOPICAL_OINTMENT | RECTAL | Status: DC | PRN

## 2016-02-17 MED ORDER — DIPHENHYDRAMINE HCL 50 MG/ML IJ SOLN: 12.5000 mg | INTRAMUSCULAR | Status: DC | PRN

## 2016-02-17 MED ORDER — ONDANSETRON HCL 4 MG/2ML IJ SOLN: 4.0000 mg | INTRAMUSCULAR | Status: DC | PRN

## 2016-02-17 MED ORDER — ONDANSETRON HCL 4 MG PO TABS: 4.0000 mg | ORAL_TABLET | ORAL | Status: DC | PRN

## 2016-02-17 MED ORDER — FLEET ENEMA 7-19 GM/118ML RE ENEM: 1.0000 | ENEMA | RECTAL | Status: DC | PRN

## 2016-02-17 MED ORDER — FENTANYL CITRATE (PF) 100 MCG/2ML IJ SOLN: 100.0000 ug | INTRAMUSCULAR | Status: DC | PRN

## 2016-02-17 MED ORDER — ZOLPIDEM TARTRATE 5 MG PO TABS: 5.0000 mg | ORAL_TABLET | Freq: Every evening | ORAL | Status: DC | PRN

## 2016-02-17 MED ORDER — ALBUTEROL SULFATE (2.5 MG/3ML) 0.083% IN NEBU: 2.5000 mg | INHALATION_SOLUTION | Freq: Four times a day (QID) | RESPIRATORY_TRACT | Status: DC | PRN

## 2016-02-17 MED ORDER — COCONUT OIL OIL: 1.0000 "application " | TOPICAL_OIL | Status: DC | PRN

## 2016-02-17 MED ORDER — PRENATAL MULTIVITAMIN CH: 1.0000 | ORAL_TABLET | Freq: Every day | ORAL | Status: DC

## 2016-02-17 NOTE — Progress Notes (Addendum)
  Subjective: Resting. Comfortable w/ epidural - has had two epidural doses.  Objective: BP 119/69   Pulse 61   Temp 97.9 F (36.6 C) (Oral)   Resp 18   Ht 5\' 4"  (1.626 m)   Wt 83 kg (183 lb)   SpO2 100%   BMI 31.41 kg/m    Today's Vitals   02/17/16 0654 02/17/16 0700 02/17/16 0701 02/17/16 0730  BP:  (!) 114/54  119/69  Pulse:  (!) 59  61  Resp:  18  18  Temp:    97.9 F (36.6 C)  TempSrc:    Oral  SpO2:    100%  Weight:      Height:      PainSc: 0-No pain  6  0-No pain   BP 100/56 at 06:30  FHT: BL 135 w/ moderate variability, +accels, late decels x 3, starting at 0615 UC: irregular, every 2-4 minutes, MVUs 120 SVE:   Dilation: 6.5 Effacement (%): 80 Station: -1 Exam by:: Thornell MuleK. Starling Jessie CNM @ [phone] Pitocin at 1 mU/min FSE placed at 0626 due to inability to trace externally   Assessment:  25 yo G2P1001 @ 38.6 wks in active labor Overall reassuring FHRT GBS neg Smoker Inadequate MVUs Drop in BP (100/56) at 06:30 likely contributed to the start of late(s)  Plan: Pit off at 0630 Amnioinfusion started at [phone] 02 Fluids Position change Monitor closely Will keep Pitocin off at this time - Bernerd PhoNancy Prothero, oncoming midwife, will determine if/when to restart Pitocin - report given to her    Sherre ScarletWILLIAMS, Onofre Gains CNM 02/17/2016, 7:30 AM

## 2016-02-17 NOTE — Anesthesia Postprocedure Evaluation (Signed)
Anesthesia Post Note  Patient: [name]  Procedure(s) Performed: * No procedures listed *  Patient location during evaluation: Mother Baby Anesthesia Type: Epidural Level of consciousness: awake Pain management: pain level controlled Vital Signs Assessment: post-procedure vital signs reviewed and stable Respiratory status: spontaneous breathing Cardiovascular status: stable Postop Assessment: no headache, no backache, epidural receding, patient able to bend at knees, no signs of nausea or vomiting and adequate PO intake Anesthetic complications: no     Last Vitals:  Vitals:   02/17/16 1040 02/17/16 1105  BP:  123/65  Pulse:  72  Resp:    Temp: 36.9 C 36.7 C    Last Pain:  Vitals:   02/17/16 1110  TempSrc:   PainSc: 0-No pain   Pain Goal: Patients Stated Pain Goal: 2 (02/17/16 0330)               Fanny DanceMULLINS,Josephyne Tarter

## 2016-02-17 NOTE — Progress Notes (Signed)
Subjective: Comfortable with epidural.    Objective: BP 118/71   Pulse 76   Temp 97.9 F (36.6 C) (Oral)   Resp 18   Ht 5\' 4"  (1.626 m)   Wt 83 kg (183 lb)   SpO2 100%   BMI 31.41 kg/m  No intake/output data recorded. No intake/output data recorded.  FHT: Category 2 UC:   regular, every 3-4 minutes SVE:   Dilation: 10 Effacement (%): 100 Station: 0 Exam by:: Bernerd PhoNancy Koraima Albertsen, CNM and C. Leshowitz, RN Pitocin at off MVUs 150  Assessment:  Pt is a [age] yo G2P1001 with EDC 02/25/2016 38.56 IUP in active labor Cat 2 strip  Plan: Labor down Monitor fetal status Anticipate NSVD  Kenney HousemanNancy Jean Nitasha Jewel CNM, MSN 02/17/2016, 8:16 AM

## 2016-02-17 NOTE — Anesthesia Procedure Notes (Signed)
Epidural Patient location during procedure: OB Start time: 02/17/2016 4:02 AM End time: 02/17/2016 4:06 AM  Staffing Anesthesiologist: Leilani AbleHATCHETT, Havoc Sanluis Performed: anesthesiologist   Preanesthetic Checklist Completed: patient identified, surgical consent, pre-op evaluation, timeout performed, IV checked, risks and benefits discussed and monitors and equipment checked  Epidural Patient position: sitting Prep: site prepped and draped and DuraPrep Patient monitoring: continuous pulse ox and blood pressure Approach: midline Location: L3-L4 Injection technique: LOR air  Needle:  Needle type: Tuohy  Needle gauge: 17 G Needle length: 9 cm and 9 Needle insertion depth: 6 cm Catheter type: closed end flexible Catheter size: 19 Gauge Catheter at skin depth: 11 cm Test dose: negative and Other  Assessment Sensory level: T10 Events: blood not aspirated, injection not painful, no injection resistance, negative IV test and no paresthesia  Additional Notes Reason for block:procedure for pain

## 2016-02-17 NOTE — MAU Note (Signed)
This is 4th visit in 24hrs. Went to [facility] about ago and when I wiped I saw dime-sized clot and cont to bleed some. Some with mucous and some just blood.

## 2016-02-17 NOTE — Anesthesia Pain Management Evaluation Note (Signed)
  CRNA Pain Management Visit Note  Patient: [name], 25 y.o., female  "Hello I am a member of the anesthesia team at [facility]. We have an anesthesia team available at all times to provide care throughout the hospital, including epidural management and anesthesia for C-section. I don't know your plan for the delivery whether it a natural birth, water birth, IV sedation, nitrous supplementation, doula or epidural, but we want to meet your pain goals."   1.Was your pain managed to your expectations on prior hospitalizations?   Yes   2.What is your expectation for pain management during this hospitalization?     Epidural  3.How can we help you reach that goal?   Record the patient's initial score and the patient's pain goal.   Pain: 0  Pain Goal: 3 [facility] wants you to be able to say your pain was always managed very well.  Laban EmperorMalinova,[name] 02/17/2016

## 2016-02-17 NOTE — MAU Note (Addendum)
G2P1 @ 38+[redacted]wksga. Presents to triage for ctx and scant mucus bleeding. See flow sheet for details of Vs.   : 0243: SVE: 6/BB  0245: Provider notified. Report status of pt given. Orders received to admit pt. L&D unit called for room by another nurse.   0255: Labs drawn and IV started with LR bolus. Pt tolerated well.   0302: pt to L&D unit.

## 2016-02-17 NOTE — Progress Notes (Signed)
  Subjective: Getting comfortable w/ epidural - placed at 04:06.  Objective: BP 123/76   Pulse 87   Temp 98 F (36.7 C) (Oral)   Resp 18   Ht 5\' 4"  (1.626 m)   Wt 83 kg (183 lb)   SpO2 100%   BMI 31.41 kg/m    Today's Vitals   02/17/16 0440 02/17/16 0458 02/17/16 0500 02/17/16 0503  BP: 126/75  123/76 123/76  Pulse: 80  87 87  Resp: 18  18   Temp:      TempSrc:      SpO2:      Weight:      Height:      PainSc:  6      FHT: BL 135 w/ moderate variability, +accels, +scalp stim, earlys, occ mild variables, late decels when supine (noted during AROM and IUPC placement) - resolved w/ position change UC:   irregular, every 2-5 minutes SVE:   Dilation: 5.5 Effacement (%): 80 Station: -1 Exam by:: Thornell MuleK. Rosezella Kronick CNM @ 0451  AROM'd, moderate amt of clear fluid at 0451 IUPC placed w/ ease at 0453   Assessment:  IUP at 38.6 wks Overall reassuring FHRT GBS neg  Plan: Start pitocin. I discussed with patient rationale for pitocin use, risks, benefits and alternatives.  Expect progress and SVD.  Sherre ScarletWILLIAMS, Nayali Talerico CNM 02/17/2016, 5:33 AM

## 2016-02-17 NOTE — Anesthesia Preprocedure Evaluation (Signed)
Anesthesia Evaluation  Patient identified by MRN, date of birth, ID band Patient awake    Reviewed: Allergy & Precautions, H&P , NPO status , Patient's Chart, lab work & pertinent test results  Airway Mallampati: I  TM Distance: >3 FB Neck ROM: full    Dental no notable dental hx.    Pulmonary Current Smoker,    Pulmonary exam normal        Cardiovascular negative cardio ROS Normal cardiovascular exam     Neuro/Psych negative neurological ROS     GI/Hepatic negative GI ROS, Neg liver ROS,   Endo/Other  negative endocrine ROS  Renal/GU negative Renal ROS     Musculoskeletal   Abdominal (+) + obese,   Peds  Hematology negative hematology ROS (+)   Anesthesia Other Findings   Reproductive/Obstetrics (+) Pregnancy                             Anesthesia Physical Anesthesia Plan  ASA: II  Anesthesia Plan: Epidural   Post-op Pain Management:    Induction:   Airway Management Planned:   Additional Equipment:   Intra-op Plan:   Post-operative Plan:   Informed Consent: I have reviewed the patients History and Physical, chart, labs and discussed the procedure including the risks, benefits and alternatives for the proposed anesthesia with the patient or authorized representative who has indicated his/her understanding and acceptance.     Plan Discussed with:   Anesthesia Plan Comments:         Anesthesia Quick Evaluation
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
## 2016-02-17 NOTE — Lactation Note (Signed)
This note was copied from a baby's chart. Lactation Consultation Note  Patient Name: [name] Metro KungCourtney Walls ZHYQM'VToday's Date: 02/17/2016 Reason for consult: Initial assessment;Other (Comment) (per mom baby recently fed for 15 mins, presently sleepying , LC encouraged mom to call for feeding assess/ Latch score ) per mom the baby has fed several times. LC  Explained the importance of having at least one LC assessment while at the hospital and RN's also can do feeding assessments latch scores.  Mother informed of post-discharge support and given phone number to the lactation department, including services for phone call assistance; out-patient appointments; and breastfeeding support group. List of other breastfeeding resources in the community given in the handout. Encouraged mother to call for problems or concerns related to breastfeeding.   Maternal Data Does the patient have breastfeeding experience prior to this delivery?: Yes  Feeding Feeding Type: Breast Fed Length of feed: 15 min (per mom )  LATCH Score/Interventions                      Lactation Tools Discussed/Used WIC Program: Yes (per mom [place] )   Consult Status Consult Status: Follow-up Date: 02/17/16 Follow-up type: In-patient    [name] 02/17/2016, 3:24 PM

## 2016-02-18 ENCOUNTER — Inpatient Hospital Stay (HOSPITAL_COMMUNITY): Payer: BLUE CROSS/BLUE SHIELD | Admitting: Anesthesiology

## 2016-02-18 ENCOUNTER — Encounter (HOSPITAL_COMMUNITY)
Admission: AD | Disposition: A | Discharge status: Home / Self Care | Payer: Self-pay | Source: Ambulatory Visit | Attending: Obstetrics and Gynecology

## 2016-02-18 HISTORY — PX: TUBAL LIGATION: SHX77

## 2016-02-18 LAB — CBC
HEMATOCRIT: 30 % — AB (ref 36.0–46.0)
HEMOGLOBIN: 9.9 g/dL — AB (ref 12.0–15.0)
MCH: 28.5 pg (ref 26.0–34.0)
MCHC: 33 g/dL (ref 30.0–36.0)
MCV: 86.5 fL (ref 78.0–100.0)
Platelets: 249 10*3/uL (ref 150–400)
RBC: 3.47 MIL/uL — AB (ref 3.87–5.11)
RDW: 14 % (ref 11.5–15.5)
WBC: 14.8 10*3/uL — ABNORMAL HIGH (ref 4.0–10.5)

## 2016-02-18 SURGERY — LIGATION, FALLOPIAN TUBE, POSTPARTUM
Anesthesia: Epidural | Site: Abdomen | Laterality: Bilateral

## 2016-02-18 MED ORDER — ONDANSETRON HCL 4 MG/2ML IJ SOLN: 4.0000 mg | INTRAMUSCULAR | Status: DC | PRN

## 2016-02-18 MED ORDER — IBUPROFEN 600 MG PO TABS
600.0000 mg | ORAL_TABLET | Freq: Four times a day (QID) | ORAL | 0 refills | Status: DC
Start: 2016-02-18 — End: 2017-11-01

## 2016-02-18 MED ORDER — PRENATAL MULTIVITAMIN CH: 1.0000 | ORAL_TABLET | Freq: Every day | ORAL | Status: DC

## 2016-02-18 MED ORDER — KETOROLAC TROMETHAMINE 30 MG/ML IJ SOLN
INTRAMUSCULAR | Status: DC | PRN
  Administered 2016-02-18: 30 mg via INTRAVENOUS

## 2016-02-18 MED ORDER — COCONUT OIL OIL: 1.0000 "application " | TOPICAL_OIL | Status: DC | PRN

## 2016-02-18 MED ORDER — OXYCODONE HCL 5 MG PO TABS: 10.0000 mg | ORAL_TABLET | ORAL | Status: DC | PRN

## 2016-02-18 MED ORDER — METOCLOPRAMIDE HCL 10 MG PO TABS
10.0000 mg | ORAL_TABLET | Freq: Once | ORAL | Status: AC
  Administered 2016-02-18: 10 mg via ORAL
  Filled 2016-02-18: qty 1

## 2016-02-18 MED ORDER — SODIUM BICARBONATE 8.4 % IV SOLN
INTRAVENOUS | Status: DC | PRN
  Administered 2016-02-18 (×4): 5 mL via EPIDURAL

## 2016-02-18 MED ORDER — MEPERIDINE HCL 25 MG/ML IJ SOLN: 6.2500 mg | INTRAMUSCULAR | Status: DC | PRN

## 2016-02-18 MED ORDER — LACTATED RINGERS IV SOLN
INTRAVENOUS | Status: DC | PRN
  Administered 2016-02-18 (×2): via INTRAVENOUS

## 2016-02-18 MED ORDER — KETOROLAC TROMETHAMINE 30 MG/ML IJ SOLN: 30.0000 mg | Freq: Once | INTRAMUSCULAR | Status: DC

## 2016-02-18 MED ORDER — LIDOCAINE-EPINEPHRINE 1 %-1:100000 IJ SOLN
INTRAMUSCULAR | Status: DC | PRN
  Administered 2016-02-18: 7 mL

## 2016-02-18 MED ORDER — FERROUS SULFATE 325 (65 FE) MG PO TABS: 325.0000 mg | ORAL_TABLET | Freq: Every day | ORAL | 1 refills | Status: DC

## 2016-02-18 MED ORDER — LACTATED RINGERS IV SOLN
INTRAVENOUS | Status: DC
  Administered 2016-02-18: 500 mL via INTRAVENOUS

## 2016-02-18 MED ORDER — LIDOCAINE-EPINEPHRINE (PF) 2 %-1:200000 IJ SOLN
INTRAMUSCULAR | Status: AC
  Filled 2016-02-18: qty 40

## 2016-02-18 MED ORDER — ONDANSETRON HCL 4 MG PO TABS: 4.0000 mg | ORAL_TABLET | ORAL | Status: DC | PRN

## 2016-02-18 MED ORDER — ONDANSETRON HCL 4 MG/2ML IJ SOLN
INTRAMUSCULAR | Status: DC | PRN
Start: 2016-02-18 — End: 2016-02-18
  Administered 2016-02-18: 4 mg via INTRAVENOUS

## 2016-02-18 MED ORDER — SODIUM BICARBONATE 8.4 % IV SOLN
INTRAVENOUS | Status: AC
  Filled 2016-02-18: qty 100

## 2016-02-18 MED ORDER — ACETAMINOPHEN 325 MG PO TABS: 650.0000 mg | ORAL_TABLET | ORAL | Status: DC | PRN

## 2016-02-18 MED ORDER — ZOLPIDEM TARTRATE 5 MG PO TABS: 5.0000 mg | ORAL_TABLET | Freq: Every evening | ORAL | Status: DC | PRN

## 2016-02-18 MED ORDER — WITCH HAZEL-GLYCERIN EX PADS: 1.0000 "application " | MEDICATED_PAD | CUTANEOUS | Status: DC | PRN

## 2016-02-18 MED ORDER — MIDAZOLAM HCL 2 MG/2ML IJ SOLN
INTRAMUSCULAR | Status: AC
  Filled 2016-02-18: qty 2

## 2016-02-18 MED ORDER — SIMETHICONE 80 MG PO CHEW: 80.0000 mg | CHEWABLE_TABLET | ORAL | Status: DC | PRN

## 2016-02-18 MED ORDER — OXYCODONE HCL 5 MG PO TABS: 5.0000 mg | ORAL_TABLET | ORAL | Status: DC | PRN

## 2016-02-18 MED ORDER — IBUPROFEN 600 MG PO TABS
600.0000 mg | ORAL_TABLET | Freq: Four times a day (QID) | ORAL | Status: DC
  Administered 2016-02-18: 600 mg via ORAL
  Filled 2016-02-18: qty 1

## 2016-02-18 MED ORDER — MAGNESIUM HYDROXIDE 400 MG/5ML PO SUSP: 30.0000 mL | ORAL | Status: DC | PRN

## 2016-02-18 MED ORDER — PROMETHAZINE HCL 25 MG/ML IJ SOLN
6.2500 mg | INTRAMUSCULAR | Status: DC | PRN
Start: 2016-02-18 — End: 2016-02-18

## 2016-02-18 MED ORDER — FENTANYL CITRATE (PF) 100 MCG/2ML IJ SOLN
INTRAMUSCULAR | Status: DC | PRN
  Administered 2016-02-18: 100 ug via INTRAVENOUS

## 2016-02-18 MED ORDER — BENZOCAINE-MENTHOL 20-0.5 % EX AERO: 1.0000 "application " | INHALATION_SPRAY | CUTANEOUS | Status: DC | PRN

## 2016-02-18 MED ORDER — HYDROMORPHONE HCL 1 MG/ML IJ SOLN: 0.2500 mg | INTRAMUSCULAR | Status: DC | PRN

## 2016-02-18 MED ORDER — FENTANYL CITRATE (PF) 100 MCG/2ML IJ SOLN
INTRAMUSCULAR | Status: AC
  Filled 2016-02-18: qty 2

## 2016-02-18 MED ORDER — MIDAZOLAM HCL 2 MG/2ML IJ SOLN
INTRAMUSCULAR | Status: DC | PRN
Start: 2016-02-18 — End: 2016-02-18
  Administered 2016-02-18: 2 mg via INTRAVENOUS

## 2016-02-18 MED ORDER — OXYCODONE HCL 5 MG PO TABS: 5.0000 mg | ORAL_TABLET | ORAL | 0 refills | Status: DC | PRN

## 2016-02-18 MED ORDER — FAMOTIDINE 20 MG PO TABS
40.0000 mg | ORAL_TABLET | Freq: Once | ORAL | Status: AC
  Administered 2016-02-18: 40 mg via ORAL
  Filled 2016-02-18: qty 2

## 2016-02-18 MED ORDER — DIPHENHYDRAMINE HCL 25 MG PO CAPS: 25.0000 mg | ORAL_CAPSULE | Freq: Four times a day (QID) | ORAL | Status: DC | PRN

## 2016-02-18 MED ORDER — DIBUCAINE 1 % RE OINT: 1.0000 "application " | TOPICAL_OINTMENT | RECTAL | Status: DC | PRN

## 2016-02-18 SURGICAL SUPPLY — 24 items
CLOTH BEACON ORANGE TIMEOUT ST (SAFETY) ×4 IMPLANT
CONTAINER PREFILL 10% NBF 15ML (MISCELLANEOUS) ×4 IMPLANT
DRSG OPSITE POSTOP 3X4 (GAUZE/BANDAGES/DRESSINGS) ×4 IMPLANT
DURAPREP 26ML APPLICATOR (WOUND CARE) ×4 IMPLANT
ELECT REM PT RETURN 9FT ADLT (ELECTROSURGICAL) ×4
ELECTRODE REM PT RTRN 9FT ADLT (ELECTROSURGICAL) ×2 IMPLANT
GLOVE BIOGEL PI IND STRL 7.0 (GLOVE) ×4 IMPLANT
GLOVE BIOGEL PI INDICATOR 7.0 (GLOVE) ×4
GLOVE ECLIPSE 6.5 STRL STRAW (GLOVE) ×4 IMPLANT
GOWN STRL REUS W/TWL LRG LVL3 (GOWN DISPOSABLE) ×8 IMPLANT
LIQUID BAND (GAUZE/BANDAGES/DRESSINGS) ×4 IMPLANT
NEEDLE HYPO 22GX1.5 SAFETY (NEEDLE) ×4 IMPLANT
NS IRRIG 1000ML POUR BTL (IV SOLUTION) ×4 IMPLANT
PACK ABDOMINAL MINOR (CUSTOM PROCEDURE TRAY) ×4 IMPLANT
PENCIL BUTTON HOLSTER BLD 10FT (ELECTRODE) ×4 IMPLANT
PROTECTOR NERVE ULNAR (MISCELLANEOUS) ×4 IMPLANT
SPONGE LAP 4X18 X RAY DECT (DISPOSABLE) ×4 IMPLANT
SUT MON AB 4-0 PS1 27 (SUTURE) ×4 IMPLANT
SUT PLAIN 1 NONE 54 (SUTURE) ×4 IMPLANT
SUT VIC AB 0 CT1 27 (SUTURE) ×2
SUT VIC AB 0 CT1 27XBRD ANBCTR (SUTURE) ×2 IMPLANT
SYR CONTROL 10ML LL (SYRINGE) ×4 IMPLANT
TOWEL OR 17X24 6PK STRL BLUE (TOWEL DISPOSABLE) ×8 IMPLANT
TRAY FOLEY CATH SILVER 14FR (SET/KITS/TRAYS/PACK) ×4 IMPLANT

## 2016-02-18 NOTE — Brief Op Note (Signed)
02/17/2016 - 02/18/2016  11:45 AM  PATIENT:  [name]  25 y.o. female  PRE-OPERATIVE DIAGNOSIS:  Desires sterilization  POST-OPERATIVE DIAGNOSIS:  Desires sterilization  PROCEDURE:  Procedure(s): POST PARTUM TUBAL LIGATION (Bilateral)- Modified Pomeroy method  SURGEON:  Surgeon(s) and Role:    * Hoover BrownsEma Sheyanne Munley, MD - Primary  ASSISTANTS: Scrub technician: Gwen PoundsShauntia   ANESTHESIA:   epidural  EBL:  Total I/O In: 1400 [I.V.:1400] Out: 55 [Urine:50; Blood:5]  BLOOD ADMINISTERED:none  DRAINS: none   LOCAL MEDICATIONS USED:  1% LIDOCAINE with epinephrine, 8cc.   SPECIMEN:  Source of Specimen:  Portions of left and right fallopian tubes.   DISPOSITION OF SPECIMEN:  PATHOLOGY  COUNTS:  YES  TOURNIQUET:  * No tourniquets in log *  DICTATION: .Note written in EPIC  PLAN OF CARE: Admit to inpatient   PATIENT DISPOSITION:  PACU - hemodynamically stable.   Delay start of Pharmacological VTE agent (>24hrs) due to surgical blood loss or risk of bleeding: not applicable

## 2016-02-18 NOTE — Anesthesia Postprocedure Evaluation (Signed)
Anesthesia Post Note  Patient: Janeli Ungar  Procedure(s) Performed: Procedure(s) (LRB): POST PARTUM TUBAL LIGATION (Bilateral)  Patient location during evaluation: Mother Baby Anesthesia Type: Epidural Level of consciousness: awake and alert and oriented Pain management: pain level controlled Respiratory status: spontaneous breathing, nonlabored ventilation and respiratory function stable Cardiovascular status: stable Postop Assessment: no headache, no backache, no signs of nausea or vomiting and adequate PO intake Anesthetic complications: no     Last Vitals:  Vitals:   02/18/16 1108 02/18/16 1327  BP: 112/67 129/65  Pulse: (!) 57   Resp: 18 18  Temp: 36.4 C 36.8 C    Last Pain:  Vitals:   02/18/16 1700  TempSrc:   PainSc: 5    Pain Goal: Patients Stated Pain Goal: 2 (02/17/16 0330)               Madison HickmanGREGORY,Anissia Wessells

## 2016-02-18 NOTE — Progress Notes (Signed)
Report given to PACU RN. Information on  flowsheet.

## 2016-02-18 NOTE — Addendum Note (Signed)
Addendum  created 02/18/16 1812 by Cindy Fullman M Raymondo Garcialopez, CRNA   Sign clinical note    

## 2016-02-18 NOTE — Lactation Note (Signed)
This note was copied from a baby's chart. Lactation Consultation Note  Patient Name: [name] ZOXWR'UToday's Date: 02/18/2016   Visited with Mom on day of discharge, baby 2227 hrs old.  Baby latching well, Mom denies any nipple pain during feeding.  Baby sleeping on Mom's chest after a 15 minute feeding.  Baby given a couple formula feedings while Mom went to OR for BTL.  Encouraged exclusive BFing, goal of 8-12 feedings per 24 hrs.  Engorgement prevention and treatment discussed.  Informed Mom of OP Lactation services available to her.  Encouraged her to call prn.  Judee ClaraSmith, Corbett Moulder E 02/18/2016, 12:05 PM

## 2016-02-18 NOTE — Discharge Summary (Signed)
Vaginal Delivery Discharge Summary  [name]  DOB:    08/29/1990 MRN:    [id number] CSN:    [id number]  Date of admission:                  02/17/2016  Date of discharge:                   02/18/2016  Procedures this admission:   1.  Vaginal delivery  2.  Postpartum Bilateral tubal ligation.   Date of Delivery: 02/17/2016  Newborn Data:  Live born female  Birth Weight: 7 lb 5.3 oz (3325 g) APGAR: 9, 9  Home with mother. Circumcision Plan: Inpatient  History of Present Illness:  [name] is a 25 y.o. female, G2P2002, who presents at 5826w6d weeks gestation. [facility] She was admitted for  onset of labor. Her pregnancy has been complicated by:  Patient Active Problem List   Diagnosis Date Noted  . Normal labor 02/17/2016  . Shellfish allergy - severe facial swelling 02/17/2016  . 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 SVD (spontaneous vaginal delivery) 02/17/2016  . Tobacco use 08/15/2013     Hospital Course:   Utilized pitocin and AROM for augmentation, epidural for pain management.  [name] without complication. Patient and baby tolerated the procedure without difficulty, with second degree laceration noted. Infant status was stable and remained in room with mother.  Mother and infant then had an uncomplicated postpartum course, with breastfeeding going well. Mom had a bilateral tubal ligation via modified pomeroy method without complications on post partum day 1.  Mom's physical exam was WNL, and she was discharged home in stable condition, she desired an early discharge.  She denies any current symptom of bipolar or depression, she did not desire to restart her prior bipolar medication, bipolar, anxiety and depression precautions were reviewed with her.     Intrapartum Procedures: spontaneous vaginal delivery and tubal ligation Postpartum Procedures: P.P. tubal ligation Complications-Operative and Postpartum: None Discharge Diagnoses: Term Pregnancy-delivered , permanent sterilization, Bipolar disorder, anxiety.    Hemoglobin Results:  CBC Latest Ref Rng & Units 02/18/2016 02/17/2016 09/12/2015  WBC 4.0 - 10.5 K/uL 14.8(H) 21.7(H) 8.0  Hemoglobin 12.0 - 15.0 g/dL 7.8(G9.9(L) 95.612.0 21.312.0  Hematocrit 36.0 - 46.0 % 30.0(L) 34.9(L) 33.6(L)  Platelets 150 - 400 K/uL 249 285 208    ABO, Rh: A/Positive/-- (04/11 0000) Antibody: Negative (04/11 0000) Rubella: Immune (04/11 0000) RPR: Nonreactive (04/11 0000)  HBsAg: Negative (04/11 0000)  HIV: Non-reactive (04/11 0000)  GBS: Negative (10/26 0000)   Discharge Physical Exam:   General: alert, cooperative and no distress Lochia: appropriate Uterine Fundus: firm Incision: no significant drainage DVT Evaluation: No evidence of DVT seen on physical exam.   Discharge Information:  Activity:           pelvic rest Diet:                routine Medications: Ibuprofen, Iron and Oxycodone Condition:      stable Instructions:  Routine pp instructions   Discharge to: home  Follow-up Information    [name],[name] Bartram WAKURU, MD. Call in 6 week(s).   Specialty:  Obstetrics and Gynecology Contact information: [address] [place] [address] [phone]  [name],[name] Bogacki WAKURU, MD 02/18/2016 5:02 PM

## 2016-02-18 NOTE — Anesthesia Postprocedure Evaluation (Signed)
Anesthesia Post Note  Patient: [name]  Procedure(s) Performed: Procedure(s) (LRB): POST PARTUM TUBAL LIGATION (Bilateral)  Patient location during evaluation: PACU Anesthesia Type: Epidural Level of consciousness: awake Pain management: pain level controlled Vital Signs Assessment: post-procedure vital signs reviewed and stable Respiratory status: spontaneous breathing Cardiovascular status: stable Postop Assessment: no headache, no backache, epidural receding, patient able to bend at knees and no signs of nausea or vomiting Anesthetic complications: no     Last Vitals:  Vitals:   02/18/16 1030 02/18/16 1045  BP: 107/66 115/77  Pulse: (!) 54 (!) 57  Resp: 10 13  Temp:  36.4 C    Last Pain:  Vitals:   02/18/16 0945  TempSrc:   PainSc: Asleep   Pain Goal: Patients Stated Pain Goal: 2 (02/17/16 0330)  LLE Motor Response: Purposeful movement (02/18/16 1045) LLE Sensation: Tingling;Full sensation (02/18/16 1045) RLE Motor Response: Purposeful movement (02/18/16 1045) RLE Sensation: Tingling;Full sensation (02/18/16 1045)      Brenae Lasecki JR,JOHN Jayanth Szczesniak

## 2016-02-18 NOTE — Transfer of Care (Signed)
Immediate Anesthesia Transfer of Care Note  Patient: Fusako Veale  Procedure(s) Performed: Procedure(s): POST PARTUM TUBAL LIGATION (Bilateral)  Patient Location: PACU  Anesthesia Type:Epidural  Level of Consciousness: awake  Airway & Oxygen Therapy: Patient Spontanous Breathing  Post-op Assessment: Report given to RN and Post -op Vital signs reviewed and stable  Post vital signs: Reviewed and stable  Last Vitals:  Vitals:   02/18/16 0635 02/18/16 0930  BP: 119/69 (!) 96/55  Pulse: (!) 54 79  Resp: 18 12  Temp: 36.8 C 36.7 C    Last Pain:  Vitals:   02/18/16 0635  TempSrc: Oral  PainSc:       Patients Stated Pain Goal: 2 (02/17/16 0330)  Complications: No apparent anesthesia complications

## 2016-02-18 NOTE — Op Note (Addendum)
Metro Kungropst, Kiaya Female, 25 y.o., [birth year] MRN: [id number]  PREOP DIAGNOSIS: Multiparous patient desiring permanent sterilization, Para 2.  POSTOP DIAGNOSIS: Same as above  PROCEDURE: Post partum bilateral tubal ligation via modified Pomeroy Method.  SURGEON: Dr. Hoover BrownsEma Kahlia Lagunes  ASSISTANT: Scrub tech: Gwen PoundsShauntia  ANESTHESIA: Bolused epidural.  COMPLICATIONS: None  EBL: 5 mL  IV FLUID: 1400 mL LR  URINE OUTPUT: 50 mL  FINDINGS: Normal uterus. Normal fallopian tubes bilaterally.   PROCEDURE:   Informed consent was obtained from the patient to undergo the procedure after discussing the risks benefits and alternatives of the procedure including risks of tubal failure, tubal regret, bleeding, infection and damage to organs.  She also understood that there are other temporary methods of birth control, and also female sterilization and she did not desire these other options.  She was taken to the operating room where anesthesia was administered without difficulty.  She was prepped abdominally in the usual sterile fashion. Foley catheter was placed in the bladder.    1% lidocaine with epinephrine was instilled in the infraumbilical area. The skin was incised with a scalpel, about 3cm long, and entry into the abdomen was made through the subcutaneous layer, fascia and peritoneal layers using Allis clamps, hemostats, retractors and metzenbaum scissors.  Once in the abdomen the patient was tilted to her left side and the right fallopian tube was identified and followed up to the fimbria end through the incision. A portion of the  Fallopian tube about 3 cm distal to the uterus cornua was grasped with a Babcock clamp and free tie of 1-0 plain used to isolate a portion of tube. Another tie was placed below the first one. The mesosalpinx was transected and then the portion of the isolated tube was transected using Metzenbaums. The edges of the cut tubes were further trimmed. The Bovie was used over the tubal  edges to enhance hemostasis. The tube was then returned into the abdominal cavity. The patient was then turned tilted to her right side and this allowed identification of the left fallopian tube. This was similarly followed up to the fimbria and and a portion of the tube isolated and transected as done on the right side. Excellent hemostasis was not added. The fascia was then closed using 0 Vicryl in a running stitch.  The skin was closed using 4-0 Monocryl subcuticular stitch. Steri strips and honey comb dressings were placed. The lap instrument and needle counts were correct. She was taken to the operating room in a stable condition.  SPECIMEN: Portions of left and right fallopian tubes  Dr. Hoover BrownsEma Arneshia Ade. 02/18/16 1503.  Procedure finished at about 0915am.

## 2016-02-18 NOTE — Progress Notes (Signed)
Post Partum Day 1 Subjective: no complaints  Objective: Blood pressure 119/69, pulse (!) 54, temperature 98.3 F (36.8 C), temperature source Oral, resp. rate 18, height 5' 4" (1.626 m), weight 83 kg (183 lb), SpO2 100 %, unknown if currently breastfeeding.  Physical Exam:  General: alert, cooperative and no distress Lochia: appropriate Uterine Fundus: firm DVT Evaluation: No evidence of DVT seen on physical exam.   Recent Labs  02/17/16 0255 02/18/16 0509  HGB 12.0 9.9*  HCT 34.9* 30.0*    Assessment/Plan: Patient desires postpartum bilateral tubal ligation via bilateral partial salpingectomy.   Patient plans to use bilateral tubal ligation via partial bilateral salpingectomy for sterilization.  We discussed risks, benefits and alternatives of postpartum tubal ligation including but not limited to risks of bleeding, infection, damage to organs.  She also understood the risk of tubal regret but she states she is 100% sure she did not want any more children.  We discussed that there is a small risk of failure of less than 0.5% with an accompanying increased risk of ectopic pregnancy in case of failure. She understood there were other kinds of birth control such as pills, patches, IUDs, vaginal rings and depo provera which were temporary but she did not desire them.  She understood there was also an option of female sterilization but she did not desire that option either.  She did not desire complete salpingectomy or tubal fulguration or placement of tubal clips or Essure sterilization.  All her questions were answered and she was consented for the procedure.  She expressed understanding that if she does have irregular or heavy bleeding after the sterilization she may require hormone control in the form of birth control devices or medication to regulate her cycles.      LOS: 1 day   [name] 02/18/2016, 8:11 AM   

## 2016-02-18 NOTE — Anesthesia Preprocedure Evaluation (Signed)
Anesthesia Evaluation  Patient identified by MRN, date of birth, ID band Patient awake    Reviewed: Allergy & Precautions, H&P , NPO status , Patient's Chart, lab work & pertinent test results  Airway Mallampati: I  TM Distance: >3 FB Neck ROM: full    Dental no notable dental hx.    Pulmonary Current Smoker,    Pulmonary exam normal        Cardiovascular negative cardio ROS Normal cardiovascular exam     Neuro/Psych negative neurological ROS     GI/Hepatic negative GI ROS, Neg liver ROS,   Endo/Other  negative endocrine ROS  Renal/GU negative Renal ROS     Musculoskeletal   Abdominal (+) + obese,   Peds  Hematology negative hematology ROS (+)   Anesthesia Other Findings   Reproductive/Obstetrics (+) Pregnancy                             Anesthesia Physical  Anesthesia Plan  ASA: II  Anesthesia Plan: Epidural   Post-op Pain Management:    Induction:   Airway Management Planned:   Additional Equipment:   Intra-op Plan:   Post-operative Plan:   Informed Consent: I have reviewed the patients History and Physical, chart, labs and discussed the procedure including the risks, benefits and alternatives for the proposed anesthesia with the patient or authorized representative who has indicated his/her understanding and acceptance.     Plan Discussed with: CRNA and Surgeon  Anesthesia Plan Comments:         Anesthesia Quick Evaluation

## 2016-02-18 NOTE — Interval H&P Note (Signed)
History and Physical Interval Note:  02/18/2016 8:20 AM  [name]  has presented today for surgery, with the diagnosis of Desires sterilization  The various methods of treatment have been discussed with the patient and family. After consideration of risks, benefits and other options for treatment, the patient has consented to  Procedure(s): POST PARTUM TUBAL LIGATION (Bilateral) as a surgical intervention .  The patient's history has been reviewed, patient examined, no change in status, stable for surgery.  I have reviewed the patient's chart and labs.  Questions were answered to the patient's satisfaction.     Konrad FelixKULWA,Adalbert Alberto WAKURU , MD

## 2016-02-18 NOTE — Progress Notes (Signed)
CSW acknowledged consult and attempted to meet with MOB, however, MOB was not in her room. CSW will attempt to meet with MOB at a later time.  Corayma Cashatt Boyd-Gilyard, MSW, LCSW Clinical Social Work [phone]  

## 2016-02-18 NOTE — H&P (View-Only) (Signed)
Post Partum Day 1 Subjective: no complaints  Objective: Blood pressure 119/69, pulse (!) 54, temperature 98.3 F (36.8 C), temperature source Oral, resp. rate 18, height 5\' 4"  (1.626 m), weight 83 kg (183 lb), SpO2 100 %, unknown if currently breastfeeding.  Physical Exam:  General: alert, cooperative and no distress Lochia: appropriate Uterine Fundus: firm DVT Evaluation: No evidence of DVT seen on physical exam.   Recent Labs  02/17/16 0255 02/18/16 0509  HGB 12.0 9.9*  HCT 34.9* 30.0*    Assessment/Plan: Patient desires postpartum bilateral tubal ligation via bilateral partial salpingectomy.   Patient plans to use bilateral tubal ligation via partial bilateral salpingectomy for sterilization.  We discussed risks, benefits and alternatives of postpartum tubal ligation including but not limited to risks of bleeding, infection, damage to organs.  She also understood the risk of tubal regret but she states she is 100% sure she did not want any more children.  We discussed that there is a small risk of failure of less than 0.5% with an accompanying increased risk of ectopic pregnancy in case of failure. She understood there were other kinds of birth control such as pills, patches, IUDs, vaginal rings and depo provera which were temporary but she did not desire them.  She understood there was also an option of female sterilization but she did not desire that option either.  She did not desire complete salpingectomy or tubal fulguration or placement of tubal clips or Essure sterilization.  All her questions were answered and she was consented for the procedure.  She expressed understanding that if she does have irregular or heavy bleeding after the sterilization she may require hormone control in the form of birth control devices or medication to regulate her cycles.      LOS: 1 day   [facility]Briasia [facility] 02/18/2016, 8:11 AM

## 2016-02-18 NOTE — Progress Notes (Signed)
Patient is requesting a discharge home this afternoon.  Notified Dr Sallye OberKulwa and she would like patient to stay overnight since she had BTL today.

## 2016-02-19 ENCOUNTER — Encounter (HOSPITAL_COMMUNITY): Payer: Self-pay | Admitting: Obstetrics & Gynecology

## 2017-07-19 ENCOUNTER — Encounter (HOSPITAL_BASED_OUTPATIENT_CLINIC_OR_DEPARTMENT_OTHER): Payer: Self-pay | Admitting: *Deleted

## 2017-07-19 ENCOUNTER — Other Ambulatory Visit: Payer: Self-pay

## 2017-07-19 ENCOUNTER — Emergency Department (HOSPITAL_BASED_OUTPATIENT_CLINIC_OR_DEPARTMENT_OTHER)
Admission: EM | Admit: 2017-07-19 | Discharge: 2017-07-19 | Disposition: A | Discharge status: Home / Self Care | Payer: Self-pay | Attending: Emergency Medicine | Admitting: Emergency Medicine

## 2017-07-19 ENCOUNTER — Emergency Department (HOSPITAL_BASED_OUTPATIENT_CLINIC_OR_DEPARTMENT_OTHER): Payer: Self-pay

## 2017-07-19 DIAGNOSIS — S92352A Displaced fracture of fifth metatarsal bone, left foot, initial encounter for closed fracture: Secondary | ICD-10-CM | POA: Insufficient documentation

## 2017-07-19 DIAGNOSIS — Y999 Unspecified external cause status: Secondary | ICD-10-CM | POA: Insufficient documentation

## 2017-07-19 DIAGNOSIS — Y9389 Activity, other specified: Secondary | ICD-10-CM | POA: Insufficient documentation

## 2017-07-19 DIAGNOSIS — Y929 Unspecified place or not applicable: Secondary | ICD-10-CM | POA: Insufficient documentation

## 2017-07-19 DIAGNOSIS — F17210 Nicotine dependence, cigarettes, uncomplicated: Secondary | ICD-10-CM | POA: Insufficient documentation

## 2017-07-19 DIAGNOSIS — J45909 Unspecified asthma, uncomplicated: Secondary | ICD-10-CM | POA: Insufficient documentation

## 2017-07-19 DIAGNOSIS — X500XXA Overexertion from strenuous movement or load, initial encounter: Secondary | ICD-10-CM | POA: Insufficient documentation

## 2017-07-19 DIAGNOSIS — S92302A Fracture of unspecified metatarsal bone(s), left foot, initial encounter for closed fracture: Secondary | ICD-10-CM

## 2017-07-19 MED ORDER — OXYCODONE HCL 5 MG PO TABS: 5.0000 mg | ORAL_TABLET | Freq: Four times a day (QID) | ORAL | 0 refills | Status: DC | PRN

## 2017-07-19 MED ORDER — OXYCODONE-ACETAMINOPHEN 5-325 MG PO TABS
1.0000 | ORAL_TABLET | Freq: Once | ORAL | Status: AC
  Administered 2017-07-19: 1 via ORAL
  Filled 2017-07-19: qty 1

## 2017-07-19 NOTE — ED Notes (Signed)
ED Provider at bedside. 

## 2017-07-19 NOTE — Discharge Instructions (Signed)
Please wear your boot for support.  Use crutches as needed. Take pain medication but be aware that it can cause drowsiness. Followup with orthopedist for further management.

## 2017-07-19 NOTE — ED Triage Notes (Signed)
Left foot injury last night while getting up in the middle of the night. Swelling, bruising noted to 5th toe.

## 2017-07-19 NOTE — ED Notes (Signed)
Pt reports that last night her L leg suddenly lost strength and "went to sleep" while walking to put her son back in the crib. This is what caused the patient to fall. Pt reports quick return of sensation and function after the fall. Pt reports she has had one other episode where she lost function in both legs and caused her to fall and that time it took some time for function to return. Pt denies any urinary or fecal incontinence.
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
## 2017-09-09 ENCOUNTER — Ambulatory Visit: Payer: 59 | Admitting: Podiatry

## 2017-09-14 ENCOUNTER — Encounter: Payer: 59 | Admitting: Podiatry

## 2017-09-16 NOTE — Progress Notes (Signed)
°  This encounter was created in error - please disregard. °

## 2017-10-31 ENCOUNTER — Other Ambulatory Visit: Payer: Self-pay

## 2017-10-31 ENCOUNTER — Emergency Department (HOSPITAL_COMMUNITY): Payer: Self-pay

## 2017-10-31 ENCOUNTER — Encounter (HOSPITAL_COMMUNITY): Payer: Self-pay | Admitting: Emergency Medicine

## 2017-10-31 ENCOUNTER — Emergency Department (HOSPITAL_COMMUNITY)
Admission: EM | Admit: 2017-10-31 | Discharge: 2017-11-01 | Disposition: A | Discharge status: Home / Self Care | Payer: Self-pay | Attending: Emergency Medicine | Admitting: Emergency Medicine

## 2017-10-31 DIAGNOSIS — F17210 Nicotine dependence, cigarettes, uncomplicated: Secondary | ICD-10-CM | POA: Insufficient documentation

## 2017-10-31 DIAGNOSIS — Z79899 Other long term (current) drug therapy: Secondary | ICD-10-CM | POA: Insufficient documentation

## 2017-10-31 DIAGNOSIS — J45909 Unspecified asthma, uncomplicated: Secondary | ICD-10-CM | POA: Insufficient documentation

## 2017-10-31 DIAGNOSIS — R0781 Pleurodynia: Secondary | ICD-10-CM | POA: Insufficient documentation

## 2017-10-31 LAB — BASIC METABOLIC PANEL
Anion gap: 7 (ref 5–15)
CO2: 25 mmol/L (ref 22–32)
Calcium: 8.7 mg/dL — ABNORMAL LOW (ref 8.9–10.3)
Chloride: 106 mmol/L (ref 98–111)
Creatinine, Ser: 0.7 mg/dL (ref 0.44–1.00)
GFR calc Af Amer: >60 mL/min (ref >60)
Glucose, Bld: 85 mg/dL (ref 70–99)
POTASSIUM: 3.5 mmol/L (ref 3.5–5.1)
Sodium: 138 mmol/L (ref 135–145)

## 2017-10-31 LAB — CBC
HEMATOCRIT: 39.2 % (ref 36.0–46.0)
Hemoglobin: 12.6 g/dL (ref 12.0–15.0)
MCH: 29.6 pg (ref 26.0–34.0)
MCHC: 32.1 g/dL (ref 30.0–36.0)
MCV: 92.2 fL (ref 78.0–100.0)
PLATELETS: 183 10*3/uL (ref 150–400)
RBC: 4.25 MIL/uL (ref 3.87–5.11)
RDW: 12.1 % (ref 11.5–15.5)
WBC: 6 10*3/uL (ref 4.0–10.5)

## 2017-10-31 LAB — I-STAT BETA HCG BLOOD, ED (MC, WL, AP ONLY)

## 2017-10-31 LAB — I-STAT TROPONIN, ED: Troponin i, poc: 0 ng/mL (ref 0.00–0.08)

## 2017-10-31 NOTE — ED Triage Notes (Signed)
Pt reports she did not feel good yesterday, began to have centralized chest pain/pressure.  Began to have nausea and SOB.

## 2017-11-01 MED ORDER — IBUPROFEN 800 MG PO TABS: 800.0000 mg | ORAL_TABLET | Freq: Three times a day (TID) | ORAL | 0 refills | Status: DC

## 2017-11-01 MED ORDER — IPRATROPIUM-ALBUTEROL 0.5-2.5 (3) MG/3ML IN SOLN
3.0000 mL | Freq: Once | RESPIRATORY_TRACT | Status: AC
  Administered 2017-11-01: 3 mL via RESPIRATORY_TRACT
  Filled 2017-11-01: qty 3

## 2017-11-01 NOTE — Discharge Instructions (Signed)
No emergent cause of chest pain was found tonight.  You could have pleuritic pain from pleurisy, or costochondritis, both of which are inflammatory processes.  Please treat this with ibuprofen as directed.  Please return for fever, productive cough, or any other new or worsening symptoms.
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
## 2018-10-08 ENCOUNTER — Emergency Department (HOSPITAL_COMMUNITY): Payer: 59

## 2018-10-08 ENCOUNTER — Emergency Department (HOSPITAL_COMMUNITY)
Admission: EM | Admit: 2018-10-08 | Discharge: 2018-10-09 | Disposition: A | Discharge status: Home / Self Care | Payer: 59 | Attending: Emergency Medicine | Admitting: Emergency Medicine

## 2018-10-08 DIAGNOSIS — F319 Bipolar disorder, unspecified: Secondary | ICD-10-CM | POA: Diagnosis not present

## 2018-10-08 DIAGNOSIS — R0789 Other chest pain: Secondary | ICD-10-CM

## 2018-10-08 DIAGNOSIS — R062 Wheezing: Secondary | ICD-10-CM | POA: Insufficient documentation

## 2018-10-08 DIAGNOSIS — R202 Paresthesia of skin: Secondary | ICD-10-CM | POA: Insufficient documentation

## 2018-10-08 DIAGNOSIS — R42 Dizziness and giddiness: Secondary | ICD-10-CM | POA: Insufficient documentation

## 2018-10-08 DIAGNOSIS — F17210 Nicotine dependence, cigarettes, uncomplicated: Secondary | ICD-10-CM | POA: Insufficient documentation

## 2018-10-08 DIAGNOSIS — Z79899 Other long term (current) drug therapy: Secondary | ICD-10-CM | POA: Insufficient documentation

## 2018-10-08 DIAGNOSIS — R072 Precordial pain: Secondary | ICD-10-CM | POA: Insufficient documentation

## 2018-10-08 DIAGNOSIS — R110 Nausea: Secondary | ICD-10-CM | POA: Diagnosis not present

## 2018-10-08 DIAGNOSIS — R0602 Shortness of breath: Secondary | ICD-10-CM | POA: Insufficient documentation

## 2018-10-08 LAB — BASIC METABOLIC PANEL
Anion gap: 8 (ref 5–15)
BUN: 5 mg/dL — ABNORMAL LOW (ref 6–20)
CO2: 23 mmol/L (ref 22–32)
Calcium: 9 mg/dL (ref 8.9–10.3)
Chloride: 108 mmol/L (ref 98–111)
Creatinine, Ser: 0.6 mg/dL (ref 0.44–1.00)
GFR calc Af Amer: >60 mL/min (ref >60)
GFR calc non Af Amer: >60 mL/min (ref >60)
Glucose, Bld: 91 mg/dL (ref 70–99)
Potassium: 3.7 mmol/L (ref 3.5–5.1)
Sodium: 139 mmol/L (ref 135–145)

## 2018-10-08 LAB — URINALYSIS, ROUTINE W REFLEX MICROSCOPIC
Bilirubin Urine: NEGATIVE
Glucose, UA: NEGATIVE mg/dL
Hgb urine dipstick: NEGATIVE
Ketones, ur: 5 mg/dL — AB
Leukocytes,Ua: NEGATIVE
Nitrite: NEGATIVE
Protein, ur: NEGATIVE mg/dL
Specific Gravity, Urine: 1.011 (ref 1.005–1.030)
pH: 7 (ref 5.0–8.0)

## 2018-10-08 LAB — I-STAT BETA HCG BLOOD, ED (MC, WL, AP ONLY): I-stat hCG, quantitative: <5 m[IU]/mL (ref <5)

## 2018-10-08 LAB — CBC WITH DIFFERENTIAL/PLATELET
Abs Immature Granulocytes: 0.03 10*3/uL (ref 0.00–0.07)
Basophils Absolute: 0 10*3/uL (ref 0.0–0.1)
Basophils Relative: 1 %
Eosinophils Absolute: 0.1 10*3/uL (ref 0.0–0.5)
Eosinophils Relative: 2 %
HCT: 45.6 % (ref 36.0–46.0)
Hemoglobin: 15 g/dL (ref 12.0–15.0)
Immature Granulocytes: 0 %
Lymphocytes Relative: 29 %
Lymphs Abs: 2.1 10*3/uL (ref 0.7–4.0)
MCH: 30.2 pg (ref 26.0–34.0)
MCHC: 32.9 g/dL (ref 30.0–36.0)
MCV: 91.9 fL (ref 80.0–100.0)
Monocytes Absolute: 0.6 10*3/uL (ref 0.1–1.0)
Monocytes Relative: 8 %
Neutro Abs: 4.5 10*3/uL (ref 1.7–7.7)
Neutrophils Relative %: 60 %
Platelets: 236 10*3/uL (ref 150–400)
RBC: 4.96 MIL/uL (ref 3.87–5.11)
RDW: 13.2 % (ref 11.5–15.5)
WBC: 7.3 10*3/uL (ref 4.0–10.5)
nRBC: 0 % (ref 0.0–0.2)

## 2018-10-08 LAB — TSH: TSH: 1.935 u[IU]/mL (ref 0.350–4.500)

## 2018-10-08 LAB — MAGNESIUM: Magnesium: 1.9 mg/dL (ref 1.7–2.4)

## 2018-10-08 LAB — TROPONIN I (HIGH SENSITIVITY): Troponin I (High Sensitivity): <2 ng/L (ref <18)

## 2018-10-08 LAB — T4, FREE: Free T4: 0.87 ng/dL (ref 0.61–1.12)

## 2018-10-08 NOTE — ED Triage Notes (Signed)
Patient started having chest pain and dizziness about an hour ago while watching tv. She denies N/V/D. She also endorses some tingling in her right toes and her left arm feels numb. Has a hx of anxiety but says this feels different. Last vitrals: 137/77, 99% on 2L. 83 NSR and 18 respirations.
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
## 2018-10-09 LAB — TROPONIN I (HIGH SENSITIVITY): Troponin I (High Sensitivity): <2 ng/L (ref <18)

## 2018-10-09 NOTE — ED Notes (Signed)
Pt discharged from ED; instructions provided; Pt encouraged to return to ED if symptoms worsen and to f/u with PCP; Pt verbalized understanding of all instructions 

## 2018-10-09 NOTE — Discharge Instructions (Signed)
You were seen in the ER for chest pain, feeling faint, low heart rate, tingling.   Your work up today was very reassuring and normal.  The cause of your symptoms is still unclear.   Since you have noticed low heart rate at home symptoms may be from intermittent low heart rate (bradycardia) which is defined at heart rate less than 60.    Stay well hydrated.   Follow up with cardiology for further discussion of your symptoms.  Return for worsening symptoms, chest pain or shortness of breath with exertion, passing out, palpitations

## 2018-10-18 ENCOUNTER — Encounter: Payer: Self-pay | Admitting: *Deleted

## 2018-10-18 ENCOUNTER — Other Ambulatory Visit: Payer: Self-pay

## 2018-10-18 ENCOUNTER — Ambulatory Visit (INDEPENDENT_AMBULATORY_CARE_PROVIDER_SITE_OTHER): Payer: 59 | Admitting: Cardiology

## 2018-10-18 ENCOUNTER — Encounter: Payer: Self-pay | Admitting: Cardiology

## 2018-10-18 VITALS — BP 122/76 | HR 77 | Ht 67.0 in | Wt 125.0 lb

## 2018-10-18 DIAGNOSIS — F17210 Nicotine dependence, cigarettes, uncomplicated: Secondary | ICD-10-CM | POA: Diagnosis not present

## 2018-10-18 DIAGNOSIS — R002 Palpitations: Secondary | ICD-10-CM | POA: Diagnosis not present

## 2018-10-18 DIAGNOSIS — R0789 Other chest pain: Secondary | ICD-10-CM

## 2018-10-18 DIAGNOSIS — R011 Cardiac murmur, unspecified: Secondary | ICD-10-CM | POA: Diagnosis not present

## 2018-10-18 DIAGNOSIS — Z720 Tobacco use: Secondary | ICD-10-CM

## 2018-10-18 DIAGNOSIS — R079 Chest pain, unspecified: Secondary | ICD-10-CM
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
## 2018-10-18 NOTE — Patient Instructions (Signed)
Medication Instructions:  Your physician recommends that you continue on your current medications as directed. Please refer to the Current Medication list given to you today.  If you need a refill on your cardiac medications before your next appointment, please call your pharmacy.   Lab work: None If you have labs (blood work) drawn today and your tests are completely normal, you will receive your results only by: Marland Kitchen MyChart Message (if you have MyChart) OR . A paper copy in the mail If you have any lab test that is abnormal or we need to change your treatment, we will call you to review the results.  Testing/Procedures: Your physician has requested that you have an echocardiogram. Echocardiography is a painless test that uses sound waves to create images of your heart. It provides your doctor with information about the size and shape of your heart and how well your heart's chambers and valves are working. This procedure takes approximately one hour. There are no restrictions for this procedure.  Your physician has requested that you have a lexiscan myoview. For further information please visit [email or web address][employer][email or web address]. Please follow instruction sheet, as given.  Your physician has recommended that you wear aZIO monitor. ZIO monitors are medical devices that record the heart's electrical activity. Doctors most often use these monitors to diagnose arrhythmias. Arrhythmias are problems with the speed or rhythm of the heartbeat. The monitor is a small, portable device. You can wear one while you do your normal daily activities. This is usually used to diagnose what is causing palpitations/syncope (passing out).  WEAR 14 DAYS  Follow-Up: At [facility], you and your health needs are our priority.  As part of our continuing mission to provide you with exceptional heart care, we have created designated Provider Care Teams.  These Care Teams include your primary Cardiologist (physician) and Advanced  Practice Providers (APPs -  Physician Assistants and Nurse Practitioners) who all work together to provide you with the care you need, when you need it. You will need a follow up appointment in 3 months.  Any Other Special Instructions Will Be Listed Below (If Applicable).

## 2018-10-18 NOTE — Addendum Note (Signed)
Addended by: Particia Nearing B on: 10/18/2018 02:36 PM   Modules accepted: Orders

## 2018-10-19 ENCOUNTER — Telehealth (HOSPITAL_COMMUNITY): Payer: Self-pay | Admitting: *Deleted

## 2018-10-19 NOTE — Telephone Encounter (Signed)
Patient given detailed instructions per Myocardial Perfusion Study Information Sheet for the test on 10/22/18 at 8:00. Patient notified to arrive 15 minutes early and that it is imperative to arrive on time for appointment to keep from having the test rescheduled.  If you need to cancel or reschedule your appointment, please call the office within 24 hours of your appointment. . Patient verbalized understanding.[name]

## 2018-10-21 ENCOUNTER — Other Ambulatory Visit (INDEPENDENT_AMBULATORY_CARE_PROVIDER_SITE_OTHER): Payer: 59

## 2018-10-21 DIAGNOSIS — R002 Palpitations: Secondary | ICD-10-CM

## 2018-10-22 ENCOUNTER — Ambulatory Visit (HOSPITAL_BASED_OUTPATIENT_CLINIC_OR_DEPARTMENT_OTHER): Payer: No Typology Code available for payment source

## 2018-10-22 ENCOUNTER — Other Ambulatory Visit: Payer: Self-pay

## 2018-10-22 ENCOUNTER — Telehealth: Payer: Self-pay

## 2018-10-22 ENCOUNTER — Ambulatory Visit (HOSPITAL_COMMUNITY): Payer: No Typology Code available for payment source | Attending: Internal Medicine

## 2018-10-22 VITALS — Ht 67.0 in | Wt 125.0 lb

## 2018-10-22 DIAGNOSIS — R079 Chest pain, unspecified: Secondary | ICD-10-CM | POA: Insufficient documentation

## 2018-10-22 DIAGNOSIS — R002 Palpitations: Secondary | ICD-10-CM | POA: Diagnosis not present

## 2018-10-22 DIAGNOSIS — R0789 Other chest pain: Secondary | ICD-10-CM | POA: Diagnosis present

## 2018-10-22 DIAGNOSIS — R011 Cardiac murmur, unspecified: Secondary | ICD-10-CM | POA: Insufficient documentation

## 2018-10-22 LAB — MYOCARDIAL PERFUSION IMAGING
LV dias vol: 107 mL (ref 46–106)
LV sys vol: 56 mL
Peak HR: 120 {beats}/min
Rest HR: 65 {beats}/min
SDS: 0
SRS: 4
SSS: 4
TID: 1.06

## 2018-10-22 LAB — ECHOCARDIOGRAM COMPLETE
Height: 67 in
Weight: 2000 oz

## 2018-10-22 MED ORDER — TECHNETIUM TC 99M TETROFOSMIN IV KIT
32.2000 | PACK | Freq: Once | INTRAVENOUS | Status: AC | PRN
  Administered 2018-10-22: 32.2 via INTRAVENOUS
  Filled 2018-10-22: qty 33

## 2018-10-22 MED ORDER — REGADENOSON 0.4 MG/5ML IV SOLN
0.4000 mg | Freq: Once | INTRAVENOUS | Status: AC
  Administered 2018-10-22: 0.4 mg via INTRAVENOUS

## 2018-10-22 MED ORDER — TECHNETIUM TC 99M TETROFOSMIN IV KIT
10.6000 | PACK | Freq: Once | INTRAVENOUS | Status: AC | PRN
  Administered 2018-10-22: 10.6 via INTRAVENOUS
  Filled 2018-10-22: qty 11

## 2018-10-22 NOTE — Telephone Encounter (Signed)
-----   Message from Jenean Lindau, MD sent at 10/22/2018  2:30 PM EDT ----- The results of the study is unremarkable. Please inform patient. I will discuss in detail at next appointment. Cc  primary care/referring physician Jenean Lindau, MD 10/22/2018 2:30 PM

## 2018-10-22 NOTE — Telephone Encounter (Signed)
Phoned patient, informed that Dr. Geraldo Pitter received echocardiogram and lexiscan results which are unremarkable. He will see her in follow-up appt. As scheduled. No further questions or concerns.

## 2018-10-22 NOTE — Telephone Encounter (Signed)
-----   Message from Rajan R Revankar, MD sent at 10/22/2018  2:30 PM EDT ----- The results of the study is unremarkable. Please inform patient. I will discuss in detail at next appointment. Cc  primary care/referring physician Rajan R Revankar, MD 10/22/2018 2:30 PM 

## 2018-10-22 NOTE — Telephone Encounter (Signed)
Relayed results from echo to patient. Patient does not have a PCP.

## 2018-10-27 ENCOUNTER — Encounter (HOSPITAL_COMMUNITY): Payer: 59

## 2018-10-29 ENCOUNTER — Other Ambulatory Visit (HOSPITAL_BASED_OUTPATIENT_CLINIC_OR_DEPARTMENT_OTHER): Payer: 59

## 2018-11-10 ENCOUNTER — Telehealth: Payer: Self-pay

## 2018-11-10 NOTE — Telephone Encounter (Signed)
Patient states she is consistently dizzy and checked her HR it was 150 BPM today. Unable to take weight or BP. She has not been able to do anything with out having symptoms of dizziness. No n/v. She has been trying to lie down as much as possible. She states she is drinking often so she feels hydrated. No swelling in her legs or feet. RN advised patient to be seen at ED or urgent care based off HR. Patient states she spent 6 hours there the last time and it was a waste of time. Note sent to Dr. Docia Furl for advisement.

## 2018-11-11 NOTE — Telephone Encounter (Signed)
Patient states she has been consistently checking BP and it is 91/67 HR 80. Patient states she still feels malaise, with some dizziness today.Feels like she is not getting enough sleep. RN reviewed her labs with her from 10/08/18 and advised patient go to urgent care if she still declines to go to ED. Message forwarded to Dr. Docia Furl for review.

## 2018-11-11 NOTE — Telephone Encounter (Signed)
Yes, need to see her pcp or urgi care. Extra salt and fluid in diet

## 2018-11-12 NOTE — Telephone Encounter (Signed)
Phoned patient who reports she has been drinking extra fluids and is feeling a bit better today, not as dizzy but has not increased sodium intake. Suggested that doing so could help in addition to fluids. She verbalizes understanding and has no further questions or concerns.

## 2018-11-13 ENCOUNTER — Emergency Department (HOSPITAL_BASED_OUTPATIENT_CLINIC_OR_DEPARTMENT_OTHER): Payer: 59

## 2018-11-13 ENCOUNTER — Encounter (HOSPITAL_BASED_OUTPATIENT_CLINIC_OR_DEPARTMENT_OTHER): Payer: Self-pay | Admitting: Adult Health

## 2018-11-13 ENCOUNTER — Other Ambulatory Visit: Payer: Self-pay

## 2018-11-13 ENCOUNTER — Emergency Department (HOSPITAL_BASED_OUTPATIENT_CLINIC_OR_DEPARTMENT_OTHER)
Admission: EM | Admit: 2018-11-13 | Discharge: 2018-11-13 | Disposition: A | Discharge status: Home / Self Care | Payer: 59 | Attending: Emergency Medicine | Admitting: Emergency Medicine

## 2018-11-13 DIAGNOSIS — J45909 Unspecified asthma, uncomplicated: Secondary | ICD-10-CM | POA: Insufficient documentation

## 2018-11-13 DIAGNOSIS — R072 Precordial pain: Secondary | ICD-10-CM | POA: Insufficient documentation

## 2018-11-13 DIAGNOSIS — F17210 Nicotine dependence, cigarettes, uncomplicated: Secondary | ICD-10-CM | POA: Diagnosis not present

## 2018-11-13 DIAGNOSIS — R002 Palpitations: Secondary | ICD-10-CM | POA: Diagnosis present

## 2018-11-13 LAB — CBC WITH DIFFERENTIAL/PLATELET
Abs Immature Granulocytes: 0.03 10*3/uL (ref 0.00–0.07)
Basophils Absolute: 0 10*3/uL (ref 0.0–0.1)
Basophils Relative: 1 %
Eosinophils Absolute: 0.1 10*3/uL (ref 0.0–0.5)
Eosinophils Relative: 2 %
HCT: 45.4 % (ref 36.0–46.0)
Hemoglobin: 14.5 g/dL (ref 12.0–15.0)
Immature Granulocytes: 1 %
Lymphocytes Relative: 23 %
Lymphs Abs: 1.4 10*3/uL (ref 0.7–4.0)
MCH: 29.8 pg (ref 26.0–34.0)
MCHC: 31.9 g/dL (ref 30.0–36.0)
MCV: 93.4 fL (ref 80.0–100.0)
Monocytes Absolute: 0.5 10*3/uL (ref 0.1–1.0)
Monocytes Relative: 7 %
Neutro Abs: 4.2 10*3/uL (ref 1.7–7.7)
Neutrophils Relative %: 66 %
Platelets: 184 10*3/uL (ref 150–400)
RBC: 4.86 MIL/uL (ref 3.87–5.11)
RDW: 12.9 % (ref 11.5–15.5)
WBC: 6.2 10*3/uL (ref 4.0–10.5)
nRBC: 0 % (ref 0.0–0.2)

## 2018-11-13 LAB — D-DIMER, QUANTITATIVE: D-Dimer, Quant: <0.27 ug/mL-FEU (ref 0.00–0.50)

## 2018-11-13 LAB — BASIC METABOLIC PANEL
Anion gap: 10 (ref 5–15)
BUN: 7 mg/dL (ref 6–20)
CO2: 24 mmol/L (ref 22–32)
Calcium: 8.9 mg/dL (ref 8.9–10.3)
Chloride: 105 mmol/L (ref 98–111)
Creatinine, Ser: 0.67 mg/dL (ref 0.44–1.00)
GFR calc Af Amer: >60 mL/min (ref >60)
GFR calc non Af Amer: >60 mL/min (ref >60)
Glucose, Bld: 89 mg/dL (ref 70–99)
Potassium: 3.9 mmol/L (ref 3.5–5.1)
Sodium: 139 mmol/L (ref 135–145)

## 2018-11-13 LAB — TROPONIN I (HIGH SENSITIVITY): Troponin I (High Sensitivity): <2 ng/L (ref <18)

## 2018-11-13 NOTE — ED Triage Notes (Signed)
Presents with palpitations that began yesterday described as "I could literally hear my heart beating and I checked my HR and it was 111, I got CP with it that lasted 30 minutes and then I became really weak and tired. This morning I woke up and it was not any better" She has a cardiologist and she recently turned in her heart rate monitor.

## 2018-11-13 NOTE — ED Notes (Signed)
ED Provider at bedside. 

## 2018-11-13 NOTE — Discharge Instructions (Signed)
You were seen in the emergency department today with heart palpitations and chest discomfort.  Your work-up today does not show a clear reason for your symptoms.  Please continue to follow with the cardiologist.  Return to the emergency department with any new or suddenly worsening symptoms.
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
## 2018-11-13 NOTE — ED Notes (Signed)
Pt reports sharp chest pain with tachycardia- feeling heart beat in chest- since last night. Pt has not received results from cardiac monitor. Pt also reports dizziness. Denies SOB.

## 2018-11-13 NOTE — ED Notes (Signed)
Patient transported to X-ray 

## 2018-11-16 ENCOUNTER — Telehealth: Payer: Self-pay | Admitting: Cardiology

## 2018-11-16 NOTE — Telephone Encounter (Signed)
Wants monitor results °

## 2018-11-17 NOTE — Telephone Encounter (Signed)
Telephone call to patient.Left message that monitor results have no resulted yet but will call her when Dr Geraldo Pitter reviews them.

## 2018-11-22 ENCOUNTER — Telehealth: Payer: Self-pay | Admitting: Cardiology

## 2018-11-22 NOTE — Telephone Encounter (Signed)
Please call patient with results of HM

## 2018-11-23 ENCOUNTER — Telehealth: Payer: Self-pay

## 2018-11-23 ENCOUNTER — Encounter: Payer: Self-pay | Admitting: *Deleted

## 2018-11-23 NOTE — Telephone Encounter (Signed)
zio monitor results are posted and patient called wanting results. Message routed to Dr. Docia Furl to review and advise.

## 2018-12-31 ENCOUNTER — Encounter: Payer: Self-pay | Admitting: Cardiology

## 2018-12-31 ENCOUNTER — Ambulatory Visit (INDEPENDENT_AMBULATORY_CARE_PROVIDER_SITE_OTHER): Payer: 59 | Admitting: Cardiology

## 2018-12-31 ENCOUNTER — Other Ambulatory Visit: Payer: Self-pay

## 2018-12-31 VITALS — BP 110/80 | HR 76 | Ht 67.0 in | Wt 131.0 lb

## 2018-12-31 DIAGNOSIS — Z720 Tobacco use: Secondary | ICD-10-CM

## 2018-12-31 DIAGNOSIS — R002 Palpitations: Secondary | ICD-10-CM

## 2018-12-31 MED ORDER — METOPROLOL SUCCINATE ER 25 MG PO TB24: 25.0000 mg | ORAL_TABLET | ORAL | 0 refills | Status: DC | PRN

## 2018-12-31 NOTE — Patient Instructions (Addendum)
Medication Instructions:  Your physician has recommended you make the following change in your medication:  START taking toprol xl 25 mg (1 tablet) as needed for palpitations  If you need a refill on your cardiac medications before your next appointment, please call your pharmacy.   Lab work: NONE If you have labs (blood work) drawn today and your tests are completely normal, you will receive your results only by: Marland Kitchen MyChart Message (if you have MyChart) OR . A paper copy in the mail If you have any lab test that is abnormal or we need to change your treatment, we will call you to review the results.  Testing/Procedures: You had an EKG performed today  Follow-Up: At [facility], you and your health needs are our priority.  As part of our continuing mission to provide you with exceptional heart care, we have created designated Provider Care Teams.  These Care Teams include your primary Cardiologist (physician) and Advanced Practice Providers (APPs -  Physician Assistants and Nurse Practitioners) who all work together to provide you with the care you need, when you need it. You will need a follow up appointment in 6 months.   Metoprolol extended-release tablets What is this medicine? METOPROLOL (me TOE proe lole) is a beta-blocker. Beta-blockers reduce the workload on the heart and help it to beat more regularly. This medicine is used to treat high blood pressure and to prevent chest pain. It is also used to after a heart attack and to prevent an additional heart attack from occurring. This medicine may be used for other purposes; ask your health care provider or pharmacist if you have questions. COMMON BRAND NAME(S): toprol, Toprol XL What should I tell my health care provider before I take this medicine? They need to know if you have any of these conditions:  diabetes  heart or vessel disease like slow heart rate, worsening heart failure, heart block, sick sinus syndrome or Raynaud's  disease  kidney disease  liver disease  lung or breathing disease, like asthma or emphysema  pheochromocytoma  thyroid disease  an unusual or allergic reaction to metoprolol, other beta-blockers, medicines, foods, dyes, or preservatives  pregnant or trying to get pregnant  breast-feeding How should I use this medicine? Take this medicine by mouth with a glass of water. Follow the directions on the prescription label. Do not crush or chew. Take this medicine with or immediately after meals. Take your doses at regular intervals. Do not take more medicine than directed. Do not stop taking this medicine suddenly. This could lead to serious heart-related effects. Talk to your pediatrician regarding the use of this medicine in children. While this drug may be prescribed for children as young as 6 years for selected conditions, precautions do apply. Overdosage: If you think you have taken too much of this medicine contact a poison control center or emergency room at once. NOTE: This medicine is only for you. Do not share this medicine with others. What if I miss a dose? If you miss a dose, take it as soon as you can. If it is almost time for your next dose, take only that dose. Do not take double or extra doses. What may interact with this medicine? This medicine may interact with the following medications:  certain medicines for blood pressure, heart disease, irregular heart beat  certain medicines for depression, like monoamine oxidase (MAO) inhibitors, fluoxetine, or paroxetine  clonidine  dobutamine  epinephrine  isoproterenol  reserpine This list may not describe  all possible interactions. Give your health care provider a list of all the medicines, herbs, non-prescription drugs, or dietary supplements you use. Also tell them if you smoke, drink alcohol, or use illegal drugs. Some items may interact with your medicine. What should I watch for while using this medicine? Visit  your doctor or health care professional for regular check ups. Contact your doctor right away if your symptoms worsen. Check your blood pressure and pulse rate regularly. Ask your health care professional what your blood pressure and pulse rate should be, and when you should contact them. You may get drowsy or dizzy. Do not drive, use machinery, or do anything that needs mental alertness until you know how this medicine affects you. Do not sit or stand up quickly, especially if you are an older patient. This reduces the risk of dizzy or fainting spells. Contact your doctor if these symptoms continue. Alcohol may interfere with the effect of this medicine. Avoid alcoholic drinks. This medicine may increase blood sugar. Ask your healthcare provider if changes in diet or medicines are needed if you have diabetes. What side effects may I notice from receiving this medicine? Side effects that you should report to your doctor or health care professional as soon as possible:  allergic reactions like skin rash, itching or hives  cold or numb hands or feet  depression  difficulty breathing  faint  fever with sore throat  irregular heartbeat, chest pain  rapid weight gain   signs and symptoms of high blood sugar such as being more thirsty or hungry or having to urinate more than normal. You may also feel very tired or have blurry vision.  swollen legs or ankles Side effects that usually do not require medical attention (report to your doctor or health care professional if they continue or are bothersome):  anxiety or nervousness  change in sex drive or performance  dry skin  headache  nightmares or trouble sleeping  short term memory loss  stomach upset or diarrhea This list may not describe all possible side effects. Call your doctor for medical advice about side effects. You may report side effects to FDA at 1-800-FDA-1088. Where should I keep my medicine? Keep out of the reach of  children. Store at room temperature between 15 and 30 degrees C (59 and 86 degrees F). Throw away any unused medicine after the expiration date. NOTE: This sheet is a summary. It may not cover all possible information. If you have questions about this medicine, talk to your doctor, pharmacist, or health care provider.  2020 Elsevier/Gold Standard (2018-01-12 11:09:41)

## 2018-12-31 NOTE — Addendum Note (Signed)
Addended by: Beckey Rutter on: 12/31/2018 03:07 PM   Modules accepted: Orders
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
## 2019-01-24 ENCOUNTER — Ambulatory Visit: Payer: 59 | Admitting: Cardiology

## 2019-03-31 ENCOUNTER — Ambulatory Visit: Payer: 59 | Admitting: Cardiology

## 2019-04-06 ENCOUNTER — Encounter (HOSPITAL_BASED_OUTPATIENT_CLINIC_OR_DEPARTMENT_OTHER): Payer: Self-pay

## 2019-04-06 ENCOUNTER — Emergency Department (HOSPITAL_BASED_OUTPATIENT_CLINIC_OR_DEPARTMENT_OTHER)
Admission: EM | Admit: 2019-04-06 | Discharge: 2019-04-06 | Disposition: A | Discharge status: Home / Self Care | Payer: 59 | Attending: Emergency Medicine | Admitting: Emergency Medicine

## 2019-04-06 ENCOUNTER — Other Ambulatory Visit: Payer: Self-pay

## 2019-04-06 ENCOUNTER — Emergency Department (HOSPITAL_BASED_OUTPATIENT_CLINIC_OR_DEPARTMENT_OTHER): Payer: 59

## 2019-04-06 DIAGNOSIS — R0789 Other chest pain: Secondary | ICD-10-CM

## 2019-04-06 DIAGNOSIS — R200 Anesthesia of skin: Secondary | ICD-10-CM | POA: Diagnosis not present

## 2019-04-06 DIAGNOSIS — J45909 Unspecified asthma, uncomplicated: Secondary | ICD-10-CM | POA: Diagnosis not present

## 2019-04-06 DIAGNOSIS — F17210 Nicotine dependence, cigarettes, uncomplicated: Secondary | ICD-10-CM | POA: Insufficient documentation

## 2019-04-06 DIAGNOSIS — R002 Palpitations: Secondary | ICD-10-CM | POA: Insufficient documentation

## 2019-04-06 DIAGNOSIS — R202 Paresthesia of skin: Secondary | ICD-10-CM | POA: Diagnosis not present

## 2019-04-06 DIAGNOSIS — R42 Dizziness and giddiness: Secondary | ICD-10-CM | POA: Diagnosis present

## 2019-04-06 DIAGNOSIS — R519 Headache, unspecified: Secondary | ICD-10-CM | POA: Diagnosis not present

## 2019-04-06 LAB — BASIC METABOLIC PANEL
Anion gap: 7 (ref 5–15)
BUN: 7 mg/dL (ref 6–20)
CO2: 26 mmol/L (ref 22–32)
Calcium: 8.8 mg/dL — ABNORMAL LOW (ref 8.9–10.3)
Chloride: 103 mmol/L (ref 98–111)
Creatinine, Ser: 0.59 mg/dL (ref 0.44–1.00)
GFR calc Af Amer: >60 mL/min (ref >60)
GFR calc non Af Amer: >60 mL/min (ref >60)
Glucose, Bld: 89 mg/dL (ref 70–99)
Potassium: 3.5 mmol/L (ref 3.5–5.1)
Sodium: 136 mmol/L (ref 135–145)

## 2019-04-06 LAB — CBC WITH DIFFERENTIAL/PLATELET
Abs Immature Granulocytes: 0.02 10*3/uL (ref 0.00–0.07)
Basophils Absolute: 0 10*3/uL (ref 0.0–0.1)
Basophils Relative: 1 %
Eosinophils Absolute: 0.1 10*3/uL (ref 0.0–0.5)
Eosinophils Relative: 1 %
HCT: 45.7 % (ref 36.0–46.0)
Hemoglobin: 14.8 g/dL (ref 12.0–15.0)
Immature Granulocytes: 0 %
Lymphocytes Relative: 30 %
Lymphs Abs: 2 10*3/uL (ref 0.7–4.0)
MCH: 30.8 pg (ref 26.0–34.0)
MCHC: 32.4 g/dL (ref 30.0–36.0)
MCV: 95.2 fL (ref 80.0–100.0)
Monocytes Absolute: 0.5 10*3/uL (ref 0.1–1.0)
Monocytes Relative: 7 %
Neutro Abs: 4 10*3/uL (ref 1.7–7.7)
Neutrophils Relative %: 61 %
Platelets: 207 10*3/uL (ref 150–400)
RBC: 4.8 MIL/uL (ref 3.87–5.11)
RDW: 13.2 % (ref 11.5–15.5)
WBC: 6.6 10*3/uL (ref 4.0–10.5)
nRBC: 0 % (ref 0.0–0.2)

## 2019-04-06 LAB — URINALYSIS, ROUTINE W REFLEX MICROSCOPIC
Bilirubin Urine: NEGATIVE
Glucose, UA: NEGATIVE mg/dL
Hgb urine dipstick: NEGATIVE
Ketones, ur: NEGATIVE mg/dL
Leukocytes,Ua: NEGATIVE
Nitrite: NEGATIVE
Protein, ur: NEGATIVE mg/dL
Specific Gravity, Urine: 1.025 (ref 1.005–1.030)
pH: 6 (ref 5.0–8.0)

## 2019-04-06 LAB — TROPONIN I (HIGH SENSITIVITY): Troponin I (High Sensitivity): <2 ng/L (ref <18)

## 2019-04-06 LAB — PREGNANCY, URINE: Preg Test, Ur: NEGATIVE
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
## 2019-04-06 NOTE — ED Triage Notes (Addendum)
Pt c/o episodes x 1 year where she becomes dizzy, develops HA, CP, numbness to right UE-today started 2 hours PTA lasting 15 min-pt states she is being followed by cards with next appt 04/21/2019-NAD-steady gait

## 2019-04-07 ENCOUNTER — Telehealth: Payer: Self-pay | Admitting: Cardiology

## 2019-04-07 NOTE — Telephone Encounter (Signed)
Spoke with pt, she is currently asymptomatic.  She is reporting ED visit and concern of orthostatic hypotension.  Advised to stay hydrated, avoid triggers such as alcohol, nicotine or caffeine.  Kepp upcoming appt on 1/14.

## 2019-04-07 NOTE — Telephone Encounter (Signed)
STAT if patient feels like he/she is going to faint   1) Are you dizzy now? no  2) Do you feel faint or have you passed out? no  3) Do you have any other symptoms? Nausea, headaches, chest pain  4) Have you checked your HR and BP (record if available)?  Hr: 106  Patient states she went to the ER and got checked out. Her BP was normal, but they assume that she may have Orthostatic Hypotension.

## 2019-04-11 ENCOUNTER — Telehealth: Payer: Self-pay | Admitting: Cardiology

## 2019-04-11 NOTE — Telephone Encounter (Signed)
Has been having dizzy spells

## 2019-04-15 ENCOUNTER — Encounter: Payer: Self-pay | Admitting: Neurology

## 2019-04-15 NOTE — Telephone Encounter (Signed)
Left message for patient to call back about new dizziness.

## 2019-04-21 ENCOUNTER — Ambulatory Visit: Payer: 59 | Admitting: Cardiology
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
## 2019-05-23 ENCOUNTER — Other Ambulatory Visit: Payer: Self-pay

## 2019-05-23 ENCOUNTER — Encounter: Payer: Self-pay | Admitting: Neurology

## 2019-05-23 ENCOUNTER — Ambulatory Visit (INDEPENDENT_AMBULATORY_CARE_PROVIDER_SITE_OTHER): Payer: 59 | Admitting: Neurology

## 2019-05-23 VITALS — BP 141/94 | HR 76 | Resp 18 | Ht 68.0 in | Wt 141.0 lb

## 2019-05-23 DIAGNOSIS — R4701 Aphasia: Secondary | ICD-10-CM | POA: Diagnosis not present

## 2019-05-23 DIAGNOSIS — R404 Transient alteration of awareness: Secondary | ICD-10-CM

## 2019-05-23 DIAGNOSIS — R200 Anesthesia of skin: Secondary | ICD-10-CM | POA: Diagnosis not present

## 2019-05-23 NOTE — Patient Instructions (Addendum)
I suspect that these events are migraine. We will check MRI of brain and MRA of head and neck to look for other causes Further recommendations after results.  May start a migraine mediation Follow up in 3 to 4 months   We have sent a referral to [facility] for your MRI and the MRA of head and Neck, and they will call you directly to schedule your appointment. They are located at [address] [facility]. If you need to contact them directly please call [phone].

## 2019-06-27 ENCOUNTER — Other Ambulatory Visit: Payer: 59

## 2019-08-10 ENCOUNTER — Ambulatory Visit
Admission: RE | Admit: 2019-08-10 | Discharge: 2019-08-10 | Disposition: A | Discharge status: Home / Self Care | Payer: 59 | Source: Ambulatory Visit | Attending: Neurology | Admitting: Neurology

## 2019-08-10 ENCOUNTER — Other Ambulatory Visit: Payer: Self-pay

## 2019-08-10 DIAGNOSIS — R200 Anesthesia of skin: Secondary | ICD-10-CM

## 2019-08-10 DIAGNOSIS — R4701 Aphasia: Secondary | ICD-10-CM

## 2019-08-10 DIAGNOSIS — R404 Transient alteration of awareness: Secondary | ICD-10-CM

## 2019-11-21 ENCOUNTER — Other Ambulatory Visit: Payer: Self-pay | Admitting: Cardiology

## 2020-10-09 IMAGING — DX CHEST - 2 VIEW
2 series · 2 of 2 positions shown · non-contrast
Comparison: Radiographs October 31, 2017.

CLINICAL DATA: Chest pain.

EXAM:
CHEST - 2 VIEW

[w chest pa]
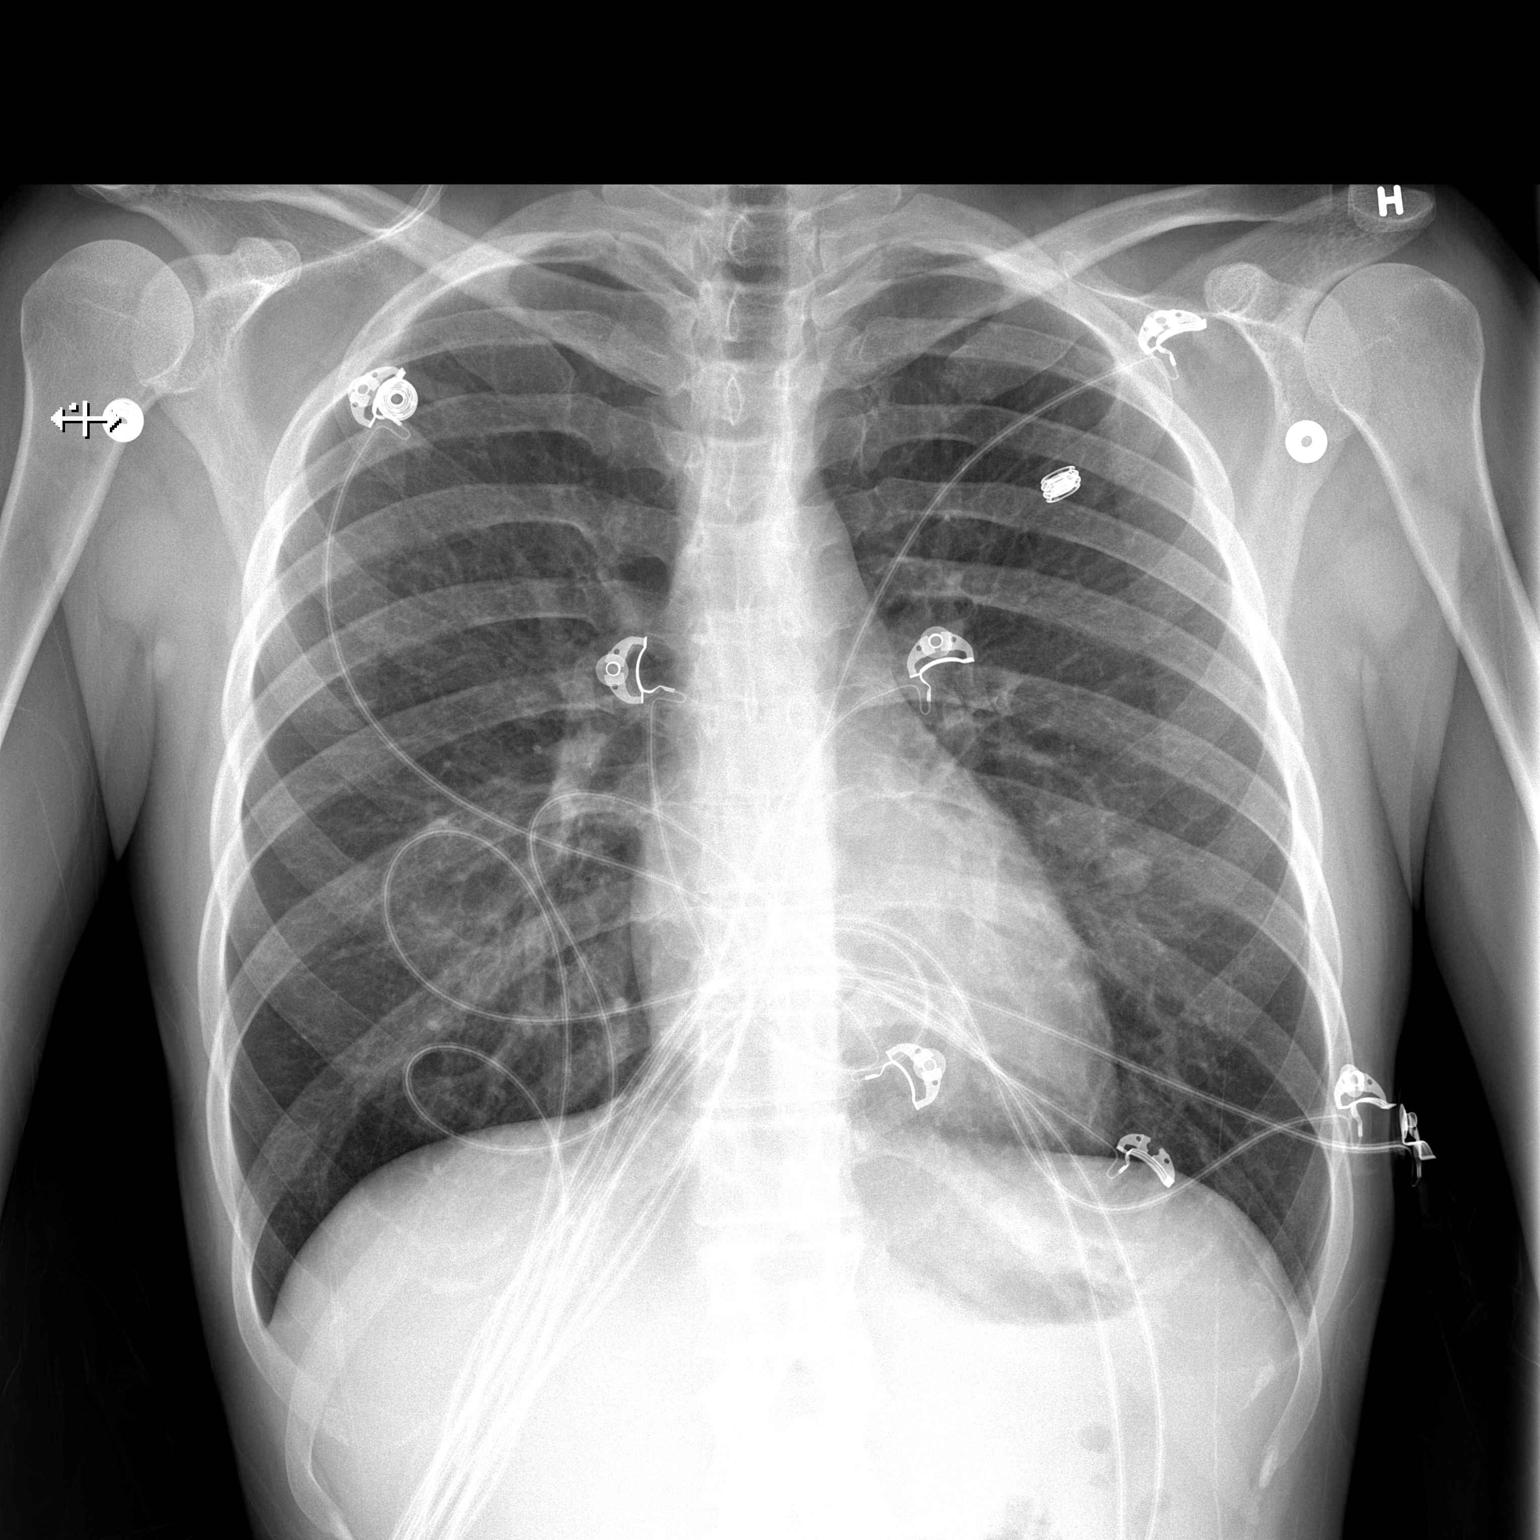

[w chest lat]
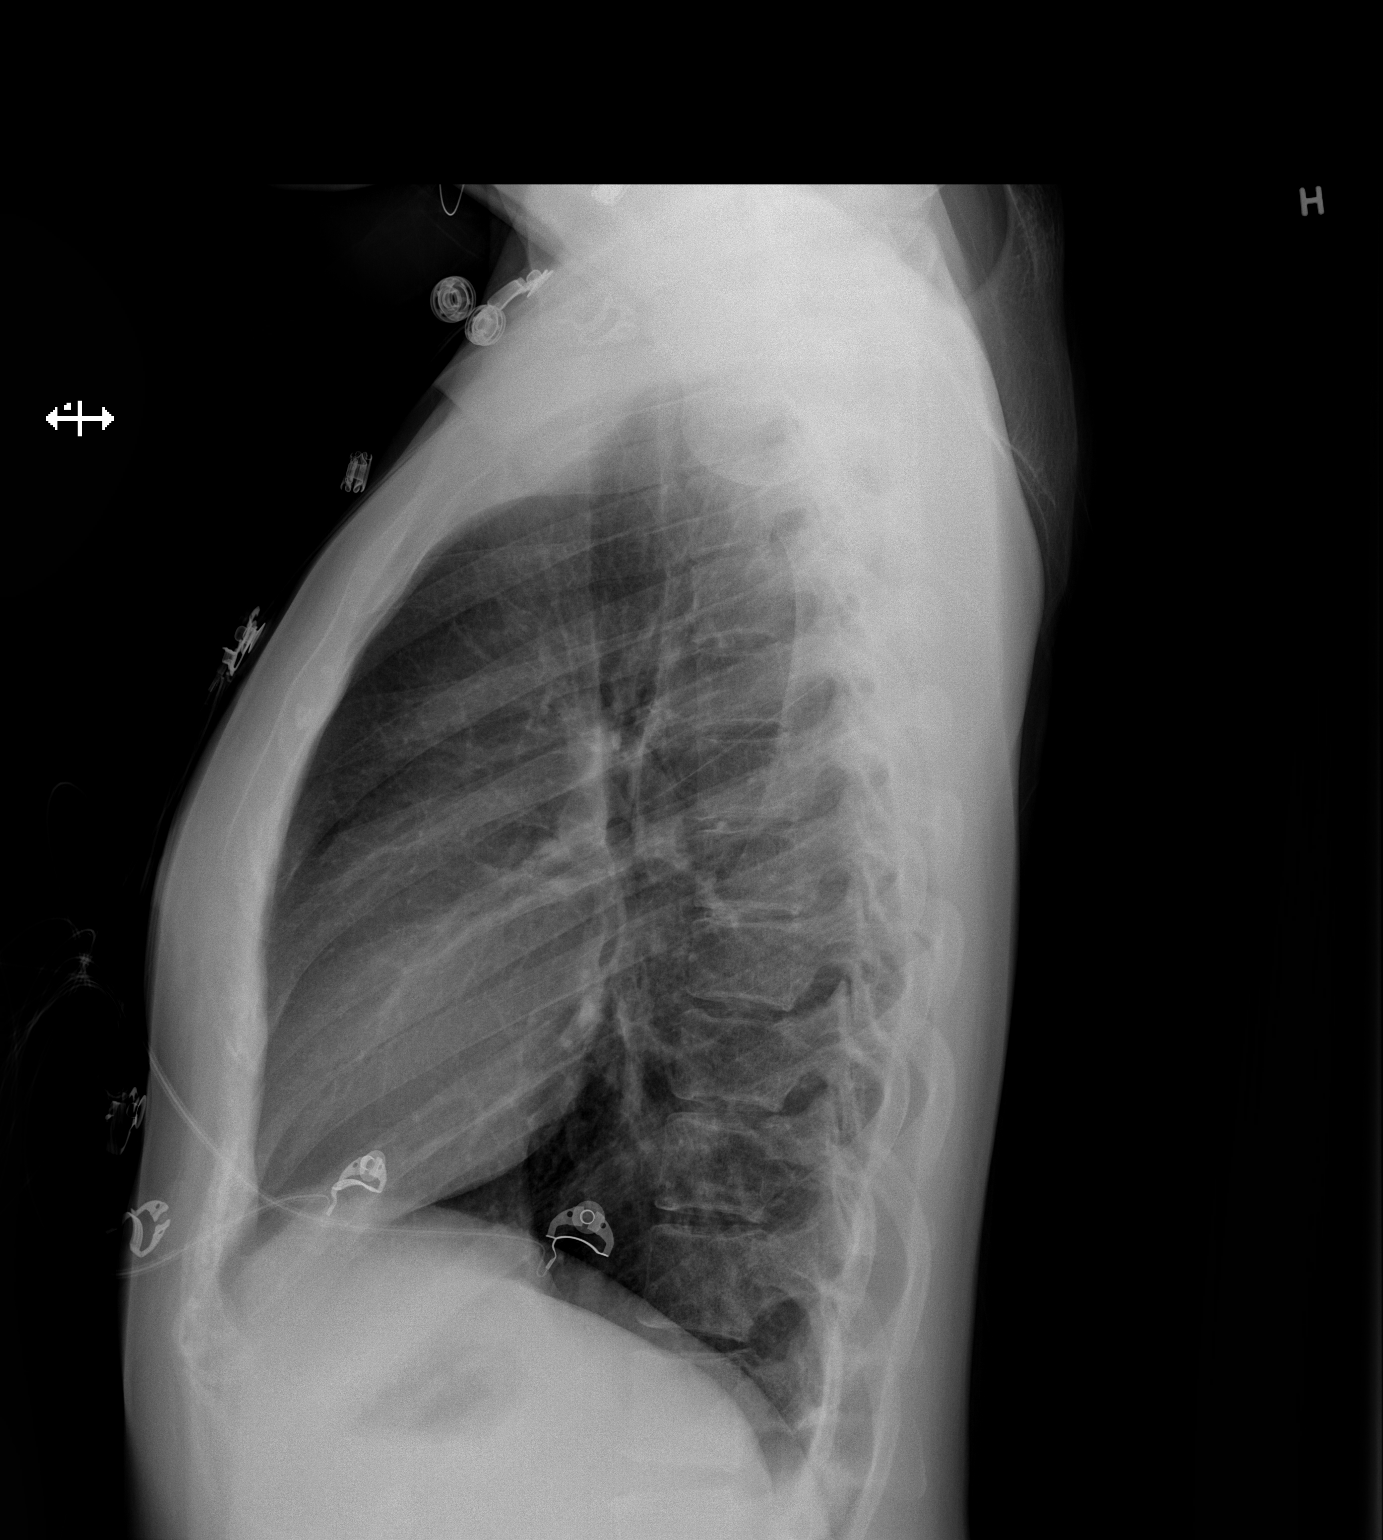

[2 of 2 positions shown; findings below may reference images not displayed]

FINDINGS: The heart size and mediastinal contours are within normal limits.
Both lungs are clear. No pneumothorax or pleural effusion is noted.
The visualized skeletal structures are unremarkable.
IMPRESSION: No active cardiopulmonary disease.

## 2021-05-09 ENCOUNTER — Encounter: Payer: Self-pay | Admitting: Neurology

## 2021-05-14 ENCOUNTER — Ambulatory Visit: Payer: Medicaid Other | Admitting: Podiatry

## 2022-10-12 ENCOUNTER — Other Ambulatory Visit: Payer: Self-pay

## 2022-10-12 ENCOUNTER — Emergency Department (HOSPITAL_COMMUNITY)
Admission: EM | Admit: 2022-10-12 | Discharge: 2022-10-12 | Disposition: A | Discharge status: Home / Self Care | Payer: 59 | Attending: Emergency Medicine | Admitting: Emergency Medicine

## 2022-10-12 ENCOUNTER — Encounter (HOSPITAL_COMMUNITY): Payer: Self-pay

## 2022-10-12 DIAGNOSIS — T7840XA Allergy, unspecified, initial encounter: Secondary | ICD-10-CM

## 2022-10-12 DIAGNOSIS — L500 Allergic urticaria: Secondary | ICD-10-CM | POA: Insufficient documentation

## 2022-10-12 DIAGNOSIS — J45909 Unspecified asthma, uncomplicated: Secondary | ICD-10-CM | POA: Diagnosis not present

## 2022-10-12 LAB — CBC
HCT: 48 % — ABNORMAL HIGH (ref 36.0–46.0)
Hemoglobin: 15.8 g/dL — ABNORMAL HIGH (ref 12.0–15.0)
MCH: 31.1 pg (ref 26.0–34.0)
MCHC: 32.9 g/dL (ref 30.0–36.0)
MCV: 94.5 fL (ref 80.0–100.0)
Platelets: 222 10*3/uL (ref 150–400)
RBC: 5.08 MIL/uL (ref 3.87–5.11)
RDW: 13.2 % (ref 11.5–15.5)
WBC: 5.2 10*3/uL (ref 4.0–10.5)
nRBC: 0 % (ref 0.0–0.2)

## 2022-10-12 LAB — BASIC METABOLIC PANEL
Anion gap: 5 (ref 5–15)
BUN: 6 mg/dL (ref 6–20)
CO2: 23 mmol/L (ref 22–32)
Calcium: 8.4 mg/dL — ABNORMAL LOW (ref 8.9–10.3)
Chloride: 108 mmol/L (ref 98–111)
Creatinine, Ser: 0.67 mg/dL (ref 0.44–1.00)
GFR, Estimated: >60 mL/min (ref >60)
Glucose, Bld: 90 mg/dL (ref 70–99)
Potassium: 3.4 mmol/L — ABNORMAL LOW (ref 3.5–5.1)
Sodium: 136 mmol/L (ref 135–145)

## 2022-10-12 LAB — HCG, SERUM, QUALITATIVE: Preg, Serum: NEGATIVE

## 2022-10-12 MED ORDER — PREDNISONE 50 MG PO TABS: 50.0000 mg | ORAL_TABLET | Freq: Every day | ORAL | 0 refills | Status: AC

## 2022-10-12 MED ORDER — LORATADINE 10 MG PO TABS: 10.0000 mg | ORAL_TABLET | Freq: Every day | ORAL | 0 refills | Status: AC | PRN

## 2022-10-12 MED ORDER — FAMOTIDINE IN NACL 20-0.9 MG/50ML-% IV SOLN
20.0000 mg | Freq: Once | INTRAVENOUS | Status: AC
  Administered 2022-10-12: 20 mg via INTRAVENOUS
  Filled 2022-10-12: qty 50

## 2022-10-12 MED ORDER — EPINEPHRINE 0.3 MG/0.3ML IJ SOAJ: 0.3000 mg | INTRAMUSCULAR | 0 refills | Status: AC | PRN

## 2022-10-12 MED ORDER — METHYLPREDNISOLONE SODIUM SUCC 125 MG IJ SOLR
125.0000 mg | Freq: Once | INTRAMUSCULAR | Status: AC
  Administered 2022-10-12: 125 mg via INTRAVENOUS
  Filled 2022-10-12: qty 2

## 2022-10-12 NOTE — ED Triage Notes (Signed)
BIB EMS from home for a sting on back of left leg. Unsure what stung her. Pt took benadryl PTA. EMS reports quarter sized welt on back on leg. An hour PTA, pt felt like she had a lump on her tongue and felt itchy all over so called EMS. Pt is not in distress or having respiratory issue on arrival.

## 2022-10-12 NOTE — ED Provider Notes (Signed)
[place] EMERGENCY DEPARTMENT AT [facility] Provider Note   CSN: [id number] Arrival date & time: 10/12/22  1113     History  Chief Complaint  Patient presents with   Insect Bite    [name] is a 32 y.o. female.  Pt is a 32 yo female with pmhx significant for bipolar d/o, asthma, PTSD and bee allergies.  Pt said she was sitting on her porch and was stung by some insect.  Pt has a bee allergy, but did not see the insect.  She developed hives and sob.  She took benadryl at home and was given benadryl by EMS.  Pt does feel a little better.       Home Medications Prior to Admission medications   Medication Sig Start Date End Date Taking? Authorizing Provider  EPINEPHrine 0.3 mg/0.3 mL IJ SOAJ injection Inject 0.3 mg into the muscle as needed for anaphylaxis. 10/12/22  Yes Jacalyn Lefevre, MD  loratadine (CLARITIN) 10 MG tablet Take 1 tablet (10 mg total) by mouth daily as needed for allergies or itching. 10/12/22  Yes Jacalyn Lefevre, MD  predniSONE (DELTASONE) 50 MG tablet Take 1 tablet (50 mg total) by mouth daily with breakfast. 10/12/22  Yes Jacalyn Lefevre, MD  albuterol (PROVENTIL HFA;VENTOLIN HFA) 108 (90 BASE) MCG/ACT inhaler Inhale 2 puffs into the lungs every 6 (six) hours as needed for wheezing or shortness of breath.    [provider]  metoprolol succinate (TOPROL-XL) 25 MG 24 hr tablet TAKE 1 TABLET BY MOUTH AS NEEDED WITH OR IMMEDIATELY FOLLOWING A MEAL 11/21/19   Revankar, Aundra Dubin, MD      Allergies    Shellfish allergy and Bee venom    Review of Systems   Review of Systems  Respiratory:  Positive for shortness of breath.   Skin:  Positive for rash.  All other systems reviewed and are negative.   Physical Exam Updated Vital Signs BP (!) 134/97   Pulse 72   Temp 98.1 F (36.7 C) (Oral)   Resp 20   Ht 5\' 8"  (1.727 m)   Wt 68 kg   SpO2 100%   BMI 22.81 kg/m  Physical Exam Vitals and nursing note reviewed.  Constitutional:       Appearance: Normal appearance.  HENT:     Head: Normocephalic and atraumatic.     Right Ear: External ear normal.     Left Ear: External ear normal.     Nose: Nose normal.     Mouth/Throat:     Mouth: Mucous membranes are dry.     Pharynx: Oropharynx is clear.  Eyes:     Extraocular Movements: Extraocular movements intact.     Conjunctiva/sclera: Conjunctivae normal.     Pupils: Pupils are equal, round, and reactive to light.  Cardiovascular:     Rate and Rhythm: Normal rate and regular rhythm.     Pulses: Normal pulses.     Heart sounds: Normal heart sounds.  Pulmonary:     Breath sounds: Wheezing present.  Abdominal:     General: Abdomen is flat. Bowel sounds are normal.     Palpations: Abdomen is soft.  Musculoskeletal:        General: Normal range of motion.     Cervical back: Normal range of motion and neck supple.  Skin:    General: Skin is warm.     Capillary Refill: Capillary refill takes less than 2 seconds.     Comments: Hives all over legs  Neurological:     General: No focal deficit present.     Mental Status: She is alert and oriented to person, place, and time.  Psychiatric:        Mood and Affect: Mood normal.        Behavior: Behavior normal.     ED Results / Procedures / Treatments   Labs (all labs ordered are listed, but only abnormal results are displayed) Labs Reviewed  CBC - Abnormal; Notable for the following components:      Result Value   Hemoglobin 15.8 (*)    HCT 48.0 (*)    All other components within normal limits  BASIC METABOLIC PANEL - Abnormal; Notable for the following components:   Potassium 3.4 (*)    Calcium 8.4 (*)    All other components within normal limits  HCG, SERUM, QUALITATIVE    EKG None  Radiology No results found.  Procedures Procedures    Medications Ordered in ED Medications  methylPREDNISolone sodium succinate (SOLU-MEDROL) 125 mg/2 mL injection 125 mg (125 mg Intravenous Given 10/12/22 1139)   famotidine (PEPCID) IVPB 20 mg premix (20 mg Intravenous New Bag/Given 10/12/22 1140)    ED Course/ Medical Decision Making/ A&P                             Medical Decision Making Amount and/or Complexity of Data Reviewed Labs: ordered.  Risk OTC drugs. Prescription drug management.   This patient presents to the ED for concern of allergy, this involves an extensive number of treatment options, and is a complaint that carries with it a high risk of complications and morbidity.  The differential diagnosis includes anaphylaxis, allergy   Co morbidities that complicate the patient evaluation  bipolar d/o, asthma, PTSD and bee allergies   Additional history obtained:  Additional history obtained from epic chart review External records from outside source obtained and reviewed including EMS report   Lab Tests:  I Ordered, and personally interpreted labs.  The pertinent results include:  cbc nl, bmp nl, preg neg   Cardiac Monitoring:  The patient was maintained on a cardiac monitor.  I personally viewed and interpreted the cardiac monitored which showed an underlying rhythm of: nsr   Medicines ordered and prescription drug management:  I ordered medication including solumedrol and pepcid  for sx  Reevaluation of the patient after these medicines showed that the patient improved I have reviewed the patients home medicines and have made adjustments as needed   Critical Interventions:  Allergy meds   Problem List / ED Course:  Allergic rxn:  pt feels much better after meds.  She is stable for d/c.  She has an inhaler at home she can use if needed.  She is d/c with prednisone, claritin, and epi pen   Reevaluation:  After the interventions noted above, I reevaluated the patient and found that they have :improved   Social Determinants of Health:  Lives at home   Dispostion:  After consideration of the diagnostic results and the patients response to treatment,  I feel that the patent would benefit from discharge with outpatient f/u.          Final Clinical Impression(s) / ED Diagnoses Final diagnoses:  Allergic reaction, initial encounter    Rx / DC Orders ED Discharge Orders          Ordered    predniSONE (DELTASONE) 50 MG tablet  Daily with breakfast  10/12/22 1305    loratadine (CLARITIN) 10 MG tablet  Daily PRN        10/12/22 1305    EPINEPHrine 0.3 mg/0.3 mL IJ SOAJ injection  As needed        10/12/22 1305              Jacalyn Lefevre, MD 10/12/22 1310

## 2022-10-12 NOTE — ED Notes (Signed)
Improvement in pt redness and rash to inner thigh

## 2023-09-11 ENCOUNTER — Other Ambulatory Visit (HOSPITAL_COMMUNITY): Payer: Self-pay | Admitting: Family Medicine

## 2023-09-11 DIAGNOSIS — R0609 Other forms of dyspnea: Secondary | ICD-10-CM

## 2023-09-24 ENCOUNTER — Telehealth (HOSPITAL_COMMUNITY): Payer: Self-pay | Admitting: *Deleted

## 2023-09-24 NOTE — Telephone Encounter (Signed)
 Reaching out to patient to offer assistance regarding upcoming cardiac imaging study; pt verbalizes understanding of appt date/time, parking situation and where to check in, pre-test NPO status and medications ordered, and verified current allergies; name and call back number provided for further questions should they arise Johney Frame RN Navigator Cardiac Imaging [facility] [phone] office [phone] cell

## 2023-09-25 ENCOUNTER — Ambulatory Visit (HOSPITAL_COMMUNITY): Admission: RE | Admit: 2023-09-25 | Payer: Self-pay | Source: Ambulatory Visit
